# Patient Record
Sex: Female | Born: 1965
Health system: Southern US, Community
[De-identification: ages and names within clinical notes are randomized; demographics above are authoritative.]

## PROBLEM LIST (undated history)

## (undated) DIAGNOSIS — E119 Type 2 diabetes mellitus without complications: Secondary | ICD-10-CM

## (undated) DIAGNOSIS — J45909 Unspecified asthma, uncomplicated: Secondary | ICD-10-CM

## (undated) DIAGNOSIS — I10 Essential (primary) hypertension: Secondary | ICD-10-CM

## (undated) HISTORY — DX: Essential (primary) hypertension: I10

## (undated) HISTORY — DX: Unspecified asthma, uncomplicated: J45.909

## (undated) HISTORY — DX: Type 2 diabetes mellitus without complications: E11.9

---

## 2017-01-28 DIAGNOSIS — E7800 Pure hypercholesterolemia, unspecified: Secondary | ICD-10-CM | POA: Diagnosis not present

## 2017-01-28 DIAGNOSIS — I10 Essential (primary) hypertension: Secondary | ICD-10-CM | POA: Diagnosis not present

## 2017-01-28 DIAGNOSIS — E119 Type 2 diabetes mellitus without complications: Secondary | ICD-10-CM | POA: Diagnosis not present

## 2017-01-28 DIAGNOSIS — M722 Plantar fascial fibromatosis: Secondary | ICD-10-CM | POA: Diagnosis not present

## 2017-01-30 DIAGNOSIS — E7800 Pure hypercholesterolemia, unspecified: Secondary | ICD-10-CM | POA: Diagnosis not present

## 2017-01-30 DIAGNOSIS — I10 Essential (primary) hypertension: Secondary | ICD-10-CM | POA: Diagnosis not present

## 2017-01-30 DIAGNOSIS — E119 Type 2 diabetes mellitus without complications: Secondary | ICD-10-CM | POA: Diagnosis not present

## 2017-04-03 DIAGNOSIS — E7800 Pure hypercholesterolemia, unspecified: Secondary | ICD-10-CM | POA: Diagnosis not present

## 2017-08-11 ENCOUNTER — Other Ambulatory Visit (HOSPITAL_COMMUNITY)
Admission: RE | Admit: 2017-08-11 | Discharge: 2017-08-11 | Disposition: A | Discharge status: Home / Self Care | Payer: BLUE CROSS/BLUE SHIELD | Source: Ambulatory Visit | Attending: Obstetrics and Gynecology | Admitting: Obstetrics and Gynecology

## 2017-08-11 ENCOUNTER — Other Ambulatory Visit: Payer: Self-pay | Admitting: Obstetrics and Gynecology

## 2017-08-11 DIAGNOSIS — Z01419 Encounter for gynecological examination (general) (routine) without abnormal findings: Secondary | ICD-10-CM | POA: Diagnosis not present

## 2017-08-11 DIAGNOSIS — Z124 Encounter for screening for malignant neoplasm of cervix: Secondary | ICD-10-CM | POA: Insufficient documentation

## 2017-08-11 DIAGNOSIS — Z139 Encounter for screening, unspecified: Secondary | ICD-10-CM

## 2017-08-12 LAB — CYTOLOGY - PAP
Diagnosis: NEGATIVE
HPV: NOT DETECTED

## 2017-08-13 ENCOUNTER — Encounter: Payer: Self-pay | Admitting: Radiology

## 2017-08-13 ENCOUNTER — Ambulatory Visit
Admission: RE | Admit: 2017-08-13 | Discharge: 2017-08-13 | Disposition: A | Discharge status: Home / Self Care | Payer: BLUE CROSS/BLUE SHIELD | Source: Ambulatory Visit | Attending: Obstetrics and Gynecology | Admitting: Obstetrics and Gynecology

## 2017-08-13 DIAGNOSIS — Z139 Encounter for screening, unspecified: Secondary | ICD-10-CM

## 2017-08-13 DIAGNOSIS — Z1231 Encounter for screening mammogram for malignant neoplasm of breast: Secondary | ICD-10-CM | POA: Diagnosis not present

## 2017-10-28 DIAGNOSIS — E7800 Pure hypercholesterolemia, unspecified: Secondary | ICD-10-CM | POA: Diagnosis not present

## 2017-10-28 DIAGNOSIS — I10 Essential (primary) hypertension: Secondary | ICD-10-CM | POA: Diagnosis not present

## 2017-10-28 DIAGNOSIS — H00014 Hordeolum externum left upper eyelid: Secondary | ICD-10-CM | POA: Diagnosis not present

## 2017-10-28 DIAGNOSIS — E119 Type 2 diabetes mellitus without complications: Secondary | ICD-10-CM | POA: Diagnosis not present

## 2017-10-28 DIAGNOSIS — H02886 Meibomian gland dysfunction of left eye, unspecified eyelid: Secondary | ICD-10-CM | POA: Diagnosis not present

## 2017-10-28 DIAGNOSIS — H18611 Keratoconus, stable, right eye: Secondary | ICD-10-CM | POA: Diagnosis not present

## 2017-10-28 DIAGNOSIS — H02883 Meibomian gland dysfunction of right eye, unspecified eyelid: Secondary | ICD-10-CM | POA: Diagnosis not present

## 2017-11-18 DIAGNOSIS — H0014 Chalazion left upper eyelid: Secondary | ICD-10-CM | POA: Diagnosis not present

## 2017-11-18 DIAGNOSIS — H02886 Meibomian gland dysfunction of left eye, unspecified eyelid: Secondary | ICD-10-CM | POA: Diagnosis not present

## 2017-11-18 DIAGNOSIS — H02883 Meibomian gland dysfunction of right eye, unspecified eyelid: Secondary | ICD-10-CM | POA: Diagnosis not present

## 2017-11-27 DIAGNOSIS — H0014 Chalazion left upper eyelid: Secondary | ICD-10-CM | POA: Diagnosis not present

## 2018-01-27 DIAGNOSIS — H18611 Keratoconus, stable, right eye: Secondary | ICD-10-CM | POA: Diagnosis not present

## 2018-01-27 DIAGNOSIS — E119 Type 2 diabetes mellitus without complications: Secondary | ICD-10-CM | POA: Diagnosis not present

## 2018-01-27 DIAGNOSIS — H2513 Age-related nuclear cataract, bilateral: Secondary | ICD-10-CM | POA: Diagnosis not present

## 2018-05-04 DIAGNOSIS — E119 Type 2 diabetes mellitus without complications: Secondary | ICD-10-CM | POA: Diagnosis not present

## 2018-05-04 DIAGNOSIS — I10 Essential (primary) hypertension: Secondary | ICD-10-CM | POA: Diagnosis not present

## 2018-05-04 DIAGNOSIS — E7800 Pure hypercholesterolemia, unspecified: Secondary | ICD-10-CM | POA: Diagnosis not present

## 2018-07-14 ENCOUNTER — Other Ambulatory Visit: Payer: Self-pay | Admitting: Obstetrics and Gynecology

## 2018-07-14 DIAGNOSIS — Z1231 Encounter for screening mammogram for malignant neoplasm of breast: Secondary | ICD-10-CM

## 2018-08-21 ENCOUNTER — Ambulatory Visit
Admission: RE | Admit: 2018-08-21 | Discharge: 2018-08-21 | Disposition: A | Discharge status: Home / Self Care | Payer: BLUE CROSS/BLUE SHIELD | Source: Ambulatory Visit | Attending: Obstetrics and Gynecology | Admitting: Obstetrics and Gynecology

## 2018-08-21 DIAGNOSIS — Z1231 Encounter for screening mammogram for malignant neoplasm of breast: Secondary | ICD-10-CM

## 2018-09-28 DIAGNOSIS — Z01419 Encounter for gynecological examination (general) (routine) without abnormal findings: Secondary | ICD-10-CM | POA: Diagnosis not present

## 2018-11-16 DIAGNOSIS — J302 Other seasonal allergic rhinitis: Secondary | ICD-10-CM | POA: Diagnosis not present

## 2018-11-16 DIAGNOSIS — I10 Essential (primary) hypertension: Secondary | ICD-10-CM | POA: Diagnosis not present

## 2018-11-16 DIAGNOSIS — E1169 Type 2 diabetes mellitus with other specified complication: Secondary | ICD-10-CM | POA: Diagnosis not present

## 2018-11-16 DIAGNOSIS — E7800 Pure hypercholesterolemia, unspecified: Secondary | ICD-10-CM | POA: Diagnosis not present

## 2018-12-14 DIAGNOSIS — J0110 Acute frontal sinusitis, unspecified: Secondary | ICD-10-CM | POA: Diagnosis not present

## 2019-02-15 DIAGNOSIS — Z1211 Encounter for screening for malignant neoplasm of colon: Secondary | ICD-10-CM | POA: Diagnosis not present

## 2019-02-15 DIAGNOSIS — D123 Benign neoplasm of transverse colon: Secondary | ICD-10-CM | POA: Diagnosis not present

## 2019-02-15 DIAGNOSIS — K5730 Diverticulosis of large intestine without perforation or abscess without bleeding: Secondary | ICD-10-CM | POA: Diagnosis not present

## 2019-03-22 DIAGNOSIS — H5213 Myopia, bilateral: Secondary | ICD-10-CM | POA: Diagnosis not present

## 2019-03-22 DIAGNOSIS — E119 Type 2 diabetes mellitus without complications: Secondary | ICD-10-CM | POA: Diagnosis not present

## 2019-03-22 DIAGNOSIS — H2513 Age-related nuclear cataract, bilateral: Secondary | ICD-10-CM | POA: Diagnosis not present

## 2019-03-22 DIAGNOSIS — H18611 Keratoconus, stable, right eye: Secondary | ICD-10-CM | POA: Diagnosis not present

## 2019-08-24 ENCOUNTER — Other Ambulatory Visit: Payer: Self-pay | Admitting: Obstetrics and Gynecology

## 2019-08-24 DIAGNOSIS — Z1231 Encounter for screening mammogram for malignant neoplasm of breast: Secondary | ICD-10-CM

## 2019-10-04 ENCOUNTER — Ambulatory Visit
Admission: RE | Admit: 2019-10-04 | Discharge: 2019-10-04 | Disposition: A | Discharge status: Home / Self Care | Payer: 59 | Source: Ambulatory Visit | Attending: Obstetrics and Gynecology | Admitting: Obstetrics and Gynecology

## 2019-10-04 ENCOUNTER — Other Ambulatory Visit: Payer: Self-pay

## 2019-10-04 DIAGNOSIS — Z1231 Encounter for screening mammogram for malignant neoplasm of breast: Secondary | ICD-10-CM

## 2020-08-30 ENCOUNTER — Other Ambulatory Visit: Payer: Self-pay | Admitting: Obstetrics and Gynecology

## 2020-08-30 DIAGNOSIS — Z1231 Encounter for screening mammogram for malignant neoplasm of breast: Secondary | ICD-10-CM

## 2020-10-17 ENCOUNTER — Ambulatory Visit: Payer: 59

## 2020-10-30 ENCOUNTER — Inpatient Hospital Stay: Admission: RE | Admit: 2020-10-30 | Payer: 59 | Source: Ambulatory Visit

## 2020-11-13 LAB — HM PAP SMEAR: HM Pap smear: NORMAL

## 2020-12-19 ENCOUNTER — Other Ambulatory Visit: Payer: Self-pay

## 2020-12-19 ENCOUNTER — Ambulatory Visit
Admission: RE | Admit: 2020-12-19 | Discharge: 2020-12-19 | Disposition: A | Discharge status: Home / Self Care | Payer: 59 | Source: Ambulatory Visit | Attending: Obstetrics and Gynecology | Admitting: Obstetrics and Gynecology

## 2020-12-19 DIAGNOSIS — Z1231 Encounter for screening mammogram for malignant neoplasm of breast: Secondary | ICD-10-CM

## 2020-12-22 ENCOUNTER — Other Ambulatory Visit: Payer: Self-pay | Admitting: Obstetrics and Gynecology

## 2020-12-22 DIAGNOSIS — R928 Other abnormal and inconclusive findings on diagnostic imaging of breast: Secondary | ICD-10-CM

## 2020-12-27 HISTORY — PX: BREAST BIOPSY: SHX20

## 2021-01-03 ENCOUNTER — Ambulatory Visit
Admission: RE | Admit: 2021-01-03 | Discharge: 2021-01-03 | Disposition: A | Discharge status: Home / Self Care | Payer: 59 | Source: Ambulatory Visit | Attending: Obstetrics and Gynecology | Admitting: Obstetrics and Gynecology

## 2021-01-03 ENCOUNTER — Other Ambulatory Visit: Payer: Self-pay | Admitting: Obstetrics and Gynecology

## 2021-01-03 ENCOUNTER — Other Ambulatory Visit: Payer: Self-pay

## 2021-01-03 DIAGNOSIS — R928 Other abnormal and inconclusive findings on diagnostic imaging of breast: Secondary | ICD-10-CM

## 2021-01-03 DIAGNOSIS — R921 Mammographic calcification found on diagnostic imaging of breast: Secondary | ICD-10-CM

## 2021-01-10 ENCOUNTER — Ambulatory Visit
Admission: RE | Admit: 2021-01-10 | Discharge: 2021-01-10 | Disposition: A | Discharge status: Home / Self Care | Payer: 59 | Source: Ambulatory Visit | Attending: Obstetrics and Gynecology | Admitting: Obstetrics and Gynecology

## 2021-01-10 ENCOUNTER — Other Ambulatory Visit: Payer: Self-pay

## 2021-01-10 ENCOUNTER — Other Ambulatory Visit: Payer: Self-pay | Admitting: Obstetrics and Gynecology

## 2021-01-10 DIAGNOSIS — R921 Mammographic calcification found on diagnostic imaging of breast: Secondary | ICD-10-CM

## 2021-07-30 ENCOUNTER — Other Ambulatory Visit (HOSPITAL_COMMUNITY): Payer: Self-pay

## 2021-11-30 ENCOUNTER — Other Ambulatory Visit: Payer: Self-pay

## 2022-01-21 DIAGNOSIS — Z1231 Encounter for screening mammogram for malignant neoplasm of breast: Secondary | ICD-10-CM

## 2022-06-26 ENCOUNTER — Ambulatory Visit (INDEPENDENT_AMBULATORY_CARE_PROVIDER_SITE_OTHER): Payer: BC Managed Care – PPO | Admitting: Family Medicine

## 2022-06-26 VITALS — BP 147/84 | HR 73 | Temp 98.1°F | Resp 16 | Ht 65.0 in | Wt 241.0 lb

## 2022-06-26 DIAGNOSIS — Z7689 Persons encountering health services in other specified circumstances: Secondary | ICD-10-CM

## 2022-06-26 DIAGNOSIS — E119 Type 2 diabetes mellitus without complications: Secondary | ICD-10-CM | POA: Diagnosis not present

## 2022-06-26 DIAGNOSIS — Z13228 Encounter for screening for other metabolic disorders: Secondary | ICD-10-CM | POA: Insufficient documentation

## 2022-06-26 DIAGNOSIS — I10 Essential (primary) hypertension: Secondary | ICD-10-CM | POA: Diagnosis not present

## 2022-06-26 DIAGNOSIS — E7800 Pure hypercholesterolemia, unspecified: Secondary | ICD-10-CM

## 2022-06-26 DIAGNOSIS — Z794 Long term (current) use of insulin: Secondary | ICD-10-CM

## 2022-06-26 DIAGNOSIS — E1159 Type 2 diabetes mellitus with other circulatory complications: Secondary | ICD-10-CM | POA: Insufficient documentation

## 2022-06-26 DIAGNOSIS — E785 Hyperlipidemia, unspecified: Secondary | ICD-10-CM | POA: Insufficient documentation

## 2022-06-26 LAB — POCT GLYCOSYLATED HEMOGLOBIN (HGB A1C): Hemoglobin A1C: 7.5 % — AB (ref 4.0–5.6)

## 2022-06-26 MED ORDER — LISINOPRIL-HYDROCHLOROTHIAZIDE 10-12.5 MG PO TABS: 1.0000 | ORAL_TABLET | Freq: Every day | ORAL | 1 refills | Status: DC

## 2022-06-26 MED ORDER — METFORMIN HCL 850 MG PO TABS: 850.0000 mg | ORAL_TABLET | Freq: Two times a day (BID) | ORAL | 1 refills | Status: DC

## 2022-06-26 MED ORDER — SIMVASTATIN 40 MG PO TABS: ORAL_TABLET | ORAL | 1 refills | Status: DC

## 2022-06-26 NOTE — Progress Notes (Unsigned)
Patient is here to established care with provider. ~health hx address ~care gaps address  

## 2022-06-27 ENCOUNTER — Encounter: Payer: Self-pay | Admitting: Family Medicine

## 2022-06-27 NOTE — Progress Notes (Signed)
New Patient Office Visit  Subjective    Patient ID: Kara Gray, female    DOB: 1966/02/06  Age: 56 y.o. MRN: [id number]  CC:  Chief Complaint  Patient presents with   Establish Care    HPI Kara Gray presents to establish care and for review of chronic med issues. Patient denies acute complaints or concerns.    Outpatient Encounter Medications as of 06/26/2022  Medication Sig   ACCU-CHEK GUIDE test strip CHECK GLUCOSE IN VITRO ONCE A DAY   Accu-Chek Softclix Lancets lancets SMARTSIG:Topical   albuterol (VENTOLIN HFA) 108 (90 Base) MCG/ACT inhaler    metFORMIN (GLUCOPHAGE) 850 MG tablet Take 1 tablet (850 mg total) by mouth 2 (two) times daily with a meal.   [DISCONTINUED] lisinopril-hydrochlorothiazide (ZESTORETIC) 10-12.5 MG tablet    [DISCONTINUED] Metformin HCl 500 MG/5ML SOLN    [DISCONTINUED] simvastatin (ZOCOR) 40 MG tablet SMARTSIG:1 Tablet(s) By Mouth Every Evening   lisinopril-hydrochlorothiazide (ZESTORETIC) 10-12.5 MG tablet Take 1 tablet by mouth daily.   simvastatin (ZOCOR) 40 MG tablet SMARTSIG:1 Tablet(s) By Mouth Every Evening   No facility-administered encounter medications on file as of 06/26/2022.    No past medical history on file.  No past surgical history on file.  Family History  Problem Relation Age of Onset   Breast cancer Paternal Aunt        unsure of age    Social History   Socioeconomic History   Marital status: Divorced    Spouse name: Not on file   Number of children: Not on file   Years of education: Not on file   Highest education level: Not on file  Occupational History   Not on file  Tobacco Use   Smoking status: Not on file   Smokeless tobacco: Not on file  Substance and Sexual Activity   Alcohol use: Not on file   Drug use: Not on file   Sexual activity: Not on file  Other Topics Concern   Not on file  Social History Narrative   Not on file   Social Determinants of Health   Financial Resource Strain: Not  on file  Food Insecurity: Not on file  Transportation Needs: Not on file  Physical Activity: Not on file  Stress: Not on file  Social Connections: Not on file  Intimate Partner Violence: Not on file    Review of Systems  All other systems reviewed and are negative.       Objective    BP (!) 147/84   Pulse 73   Temp 98.1 F (36.7 C) (Oral)   Resp 16   Ht 5\' 5"  (1.651 m)   Wt 241 lb (109.3 kg)   SpO2 95%   BMI 40.10 kg/m   Physical Exam Vitals and nursing note reviewed.  Constitutional:      General: She is not in acute distress. Cardiovascular:     Rate and Rhythm: Normal rate and regular rhythm.  Pulmonary:     Effort: Pulmonary effort is normal.     Breath sounds: Normal breath sounds.  Abdominal:     Palpations: Abdomen is soft.     Tenderness: There is no abdominal tenderness.  Musculoskeletal:     Right lower leg: No edema.     Left lower leg: No edema.  Neurological:     General: No focal deficit present.     Mental Status: She is alert and oriented to person, place, and time.  Psychiatric:  Mood and Affect: Mood normal.        Behavior: Behavior normal.         Assessment & Plan:   1. Type 2 diabetes mellitus without complication, with long-term current use of insulin (HCC) A1c is elevated above goal. Will increase metformin from 500 to 850 mg bid and monitor - POCT glycosylated hemoglobin (Hb A1C)  2. Primary hypertension Slightly elevated reading. Patient defers change in management at this time. Meds refilled  3. Hypercholesterolemia Continue present management. Meds refilled.   4. Encounter to establish care     Return in about 3 months (around 09/26/2022) for follow up.   Tommie Raymond, MD

## 2022-07-01 DIAGNOSIS — H01119 Allergic dermatitis of unspecified eye, unspecified eyelid: Secondary | ICD-10-CM | POA: Diagnosis not present

## 2022-07-09 ENCOUNTER — Encounter: Payer: Self-pay | Admitting: Family Medicine

## 2022-07-16 ENCOUNTER — Other Ambulatory Visit: Payer: Self-pay | Admitting: *Deleted

## 2022-07-16 MED ORDER — LISINOPRIL-HYDROCHLOROTHIAZIDE 10-12.5 MG PO TABS: 1.0000 | ORAL_TABLET | Freq: Every day | ORAL | 1 refills | Status: DC

## 2022-07-18 ENCOUNTER — Encounter: Payer: Self-pay | Admitting: Family Medicine

## 2022-08-06 DIAGNOSIS — Z79899 Other long term (current) drug therapy: Secondary | ICD-10-CM | POA: Diagnosis not present

## 2022-08-06 DIAGNOSIS — E7800 Pure hypercholesterolemia, unspecified: Secondary | ICD-10-CM | POA: Diagnosis not present

## 2022-08-06 DIAGNOSIS — E113292 Type 2 diabetes mellitus with mild nonproliferative diabetic retinopathy without macular edema, left eye: Secondary | ICD-10-CM | POA: Diagnosis not present

## 2022-08-06 DIAGNOSIS — Z6839 Body mass index (BMI) 39.0-39.9, adult: Secondary | ICD-10-CM | POA: Diagnosis not present

## 2022-09-26 ENCOUNTER — Ambulatory Visit: Payer: BC Managed Care – PPO | Admitting: Family Medicine

## 2022-10-07 LAB — HM DIABETES EYE EXAM

## 2022-10-31 IMAGING — MG MM BREAST LOCALIZATION CLIP
5 series · 6 of 13 positions shown · non-contrast
Comparison: Previous exam(s).

CLINICAL DATA: Post biopsy mammogram of the right breast for clip
placement.

EXAM:
DIAGNOSTIC RIGHT MAMMOGRAM POST STEREOTACTIC BIOPSY

[R CC]
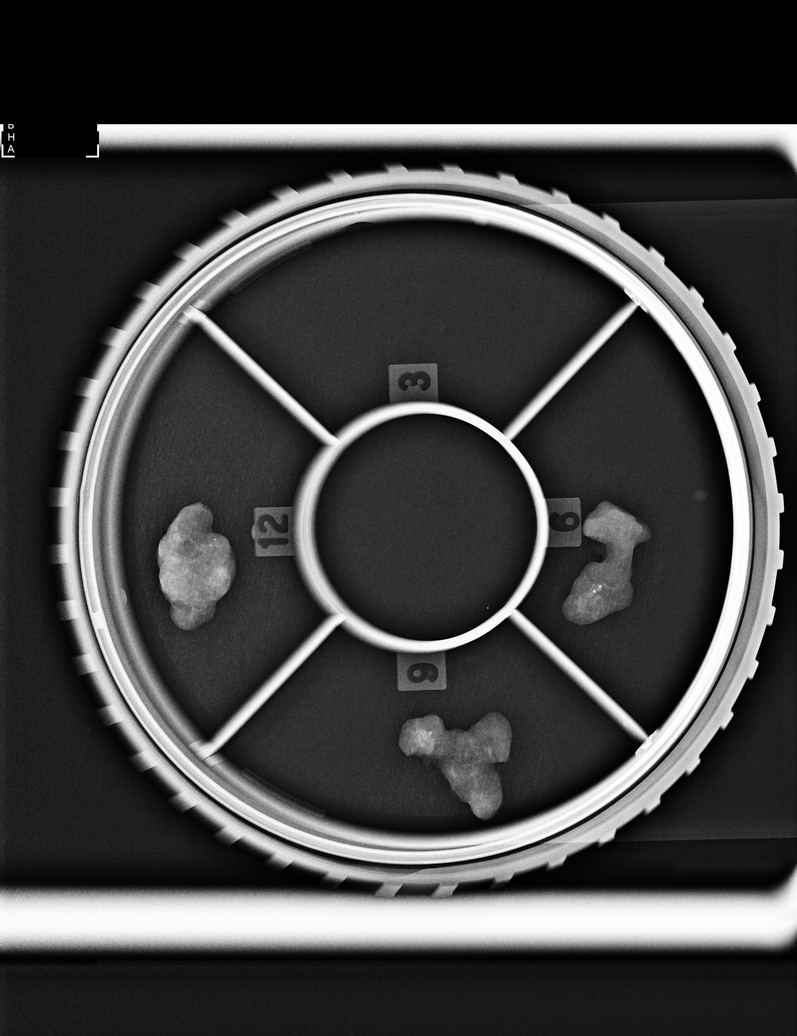

[R ML synth-2D]
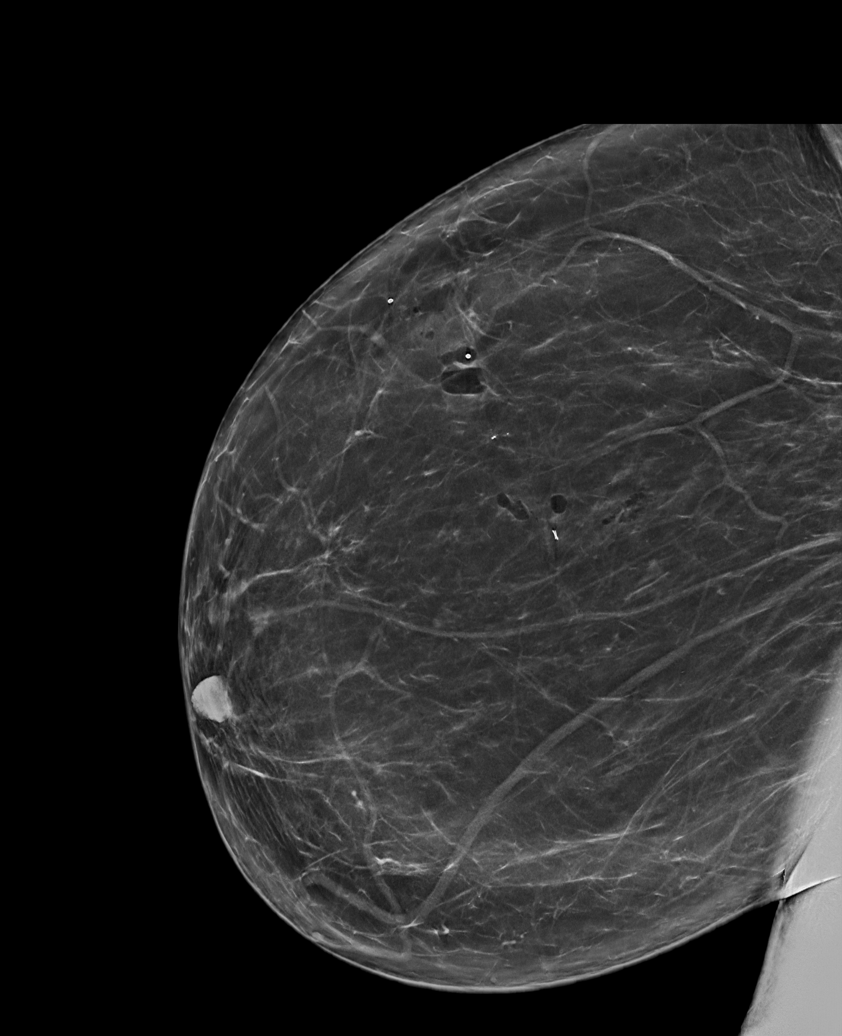

[R CC synth-2D]
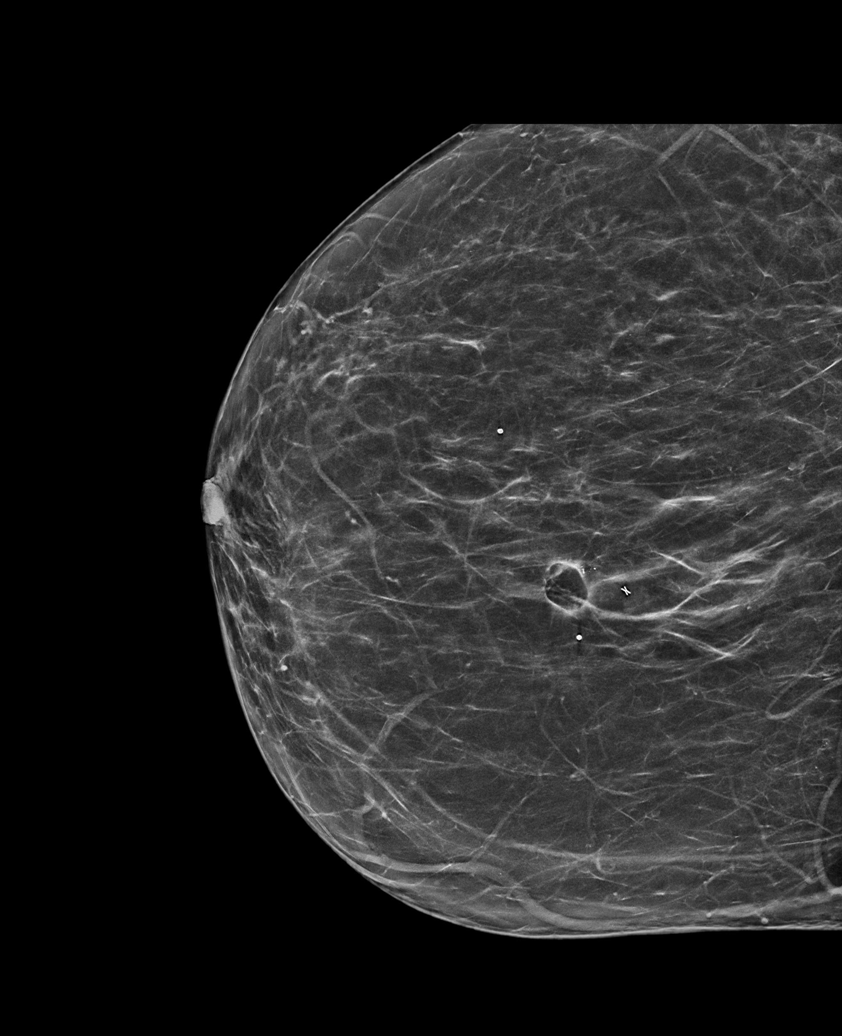

[R CC tomo · 2 of 75 frames shown]
[frame 25/75]
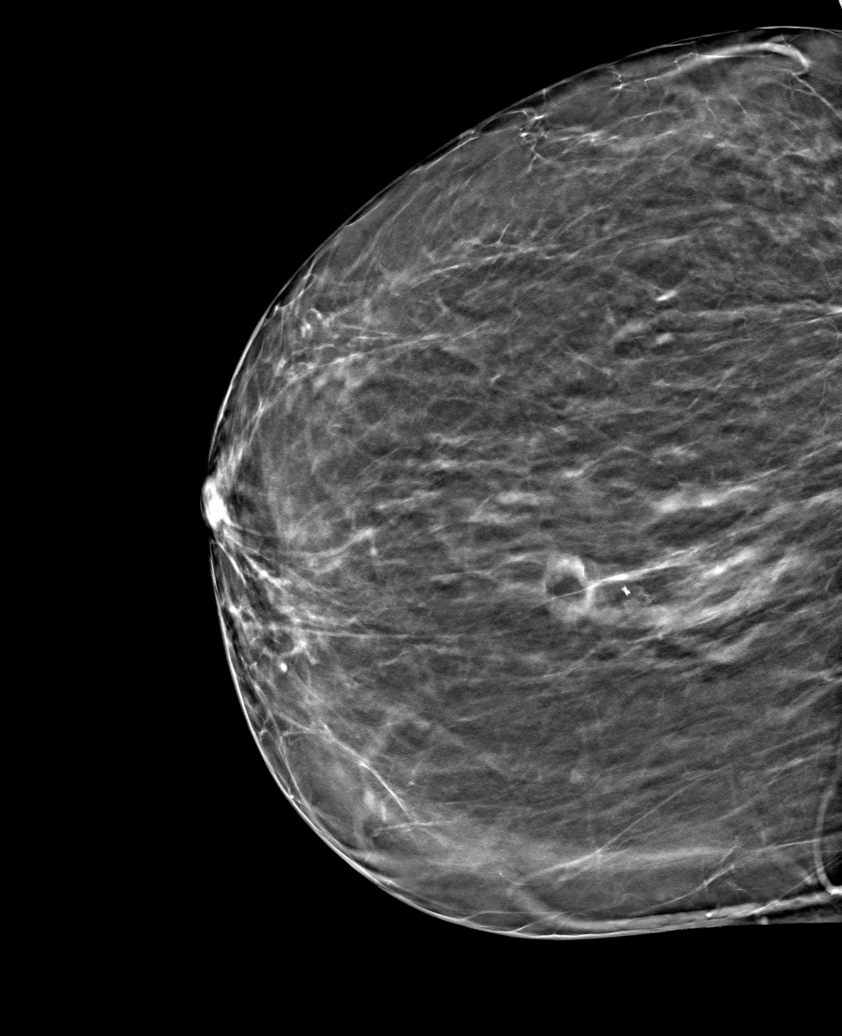
[frame 38/75]
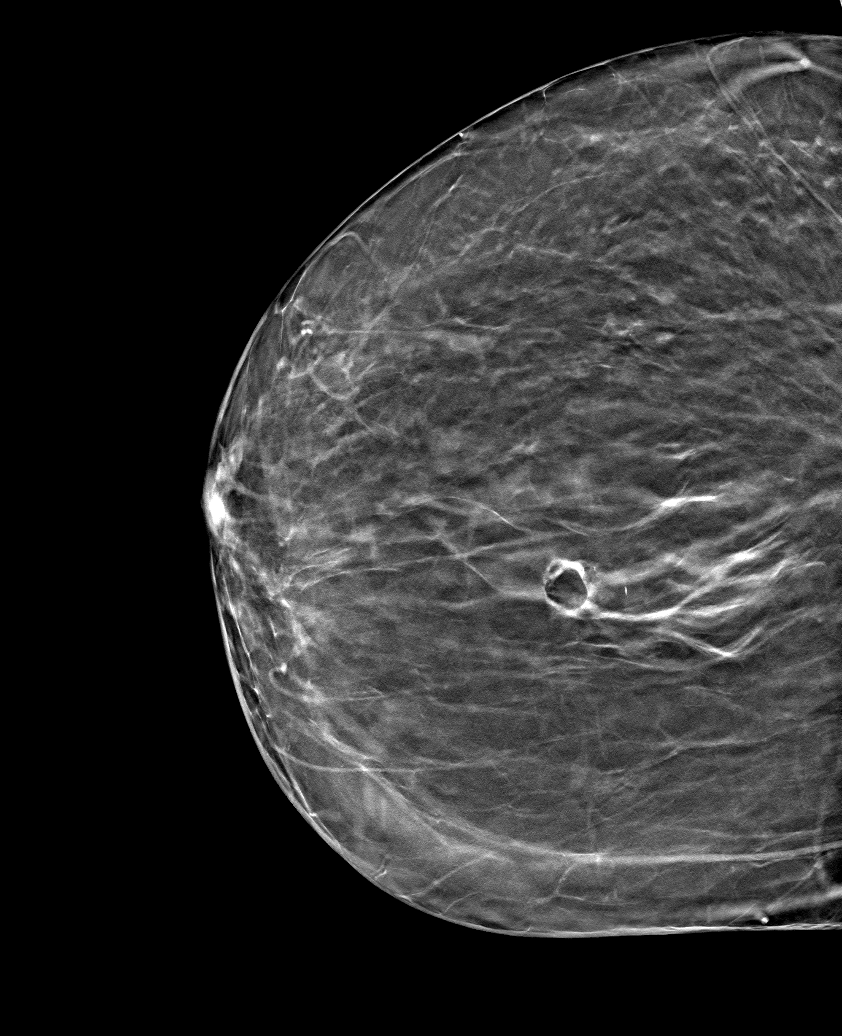

[R ML tomo · tomo slice 41/82.0]
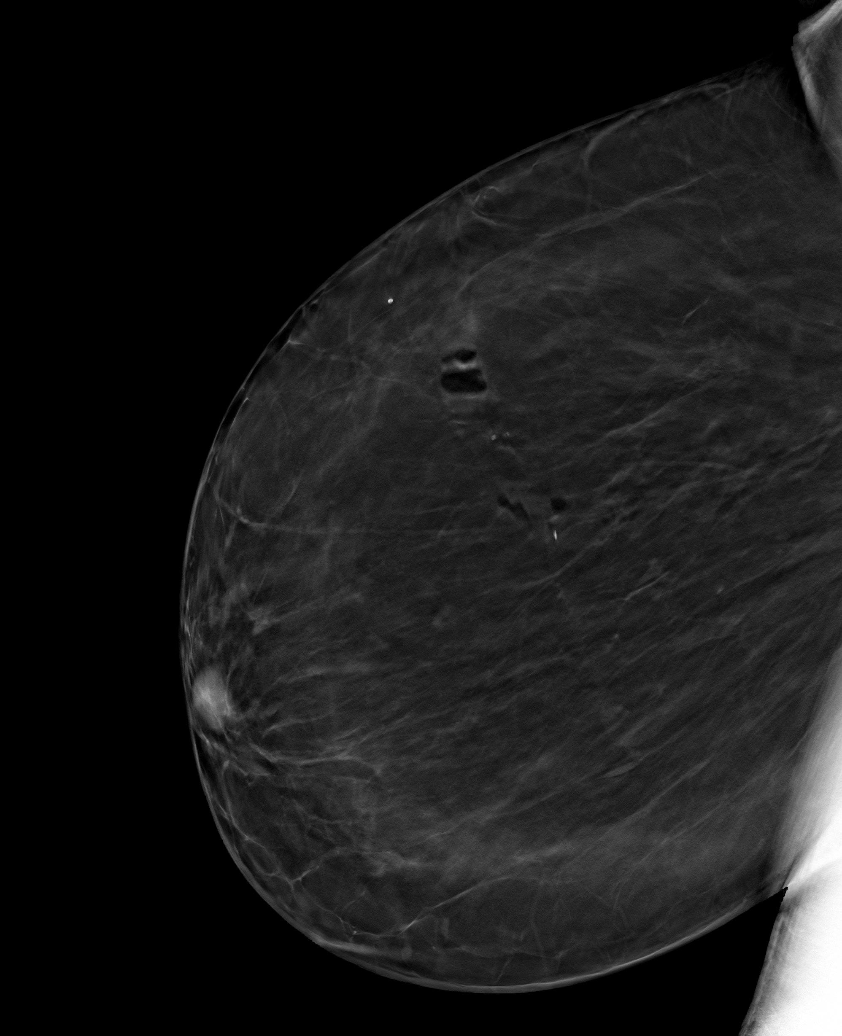

[6 of 13 positions shown; findings below may reference images not displayed]

FINDINGS: Mammographic images were obtained following stereotactic guided
biopsy of calcifications in the upper inner right breast. The biopsy
marking clip is is approximately 2.8 cm inferiorly displaced from
the site of biopsy. There are residual calcifications for
localization if necessary.
IMPRESSION: The X shaped biopsy marking clip is approximately 2.8 cm inferiorly
displaced from the site of biopsy in the upper inner right breast.
If localization for excision is necessary, there are residual
calcifications for targeting.

Final Assessment: Post Procedure Mammograms for Marker Placement

## 2022-11-08 DIAGNOSIS — Z79899 Other long term (current) drug therapy: Secondary | ICD-10-CM | POA: Diagnosis not present

## 2022-11-08 DIAGNOSIS — I10 Essential (primary) hypertension: Secondary | ICD-10-CM | POA: Diagnosis not present

## 2022-11-08 DIAGNOSIS — E7800 Pure hypercholesterolemia, unspecified: Secondary | ICD-10-CM | POA: Diagnosis not present

## 2022-11-08 DIAGNOSIS — E113292 Type 2 diabetes mellitus with mild nonproliferative diabetic retinopathy without macular edema, left eye: Secondary | ICD-10-CM | POA: Diagnosis not present

## 2022-11-11 DIAGNOSIS — J309 Allergic rhinitis, unspecified: Secondary | ICD-10-CM | POA: Diagnosis not present

## 2022-11-11 DIAGNOSIS — E113292 Type 2 diabetes mellitus with mild nonproliferative diabetic retinopathy without macular edema, left eye: Secondary | ICD-10-CM | POA: Diagnosis not present

## 2022-11-11 DIAGNOSIS — I10 Essential (primary) hypertension: Secondary | ICD-10-CM | POA: Diagnosis not present

## 2022-11-11 DIAGNOSIS — Z79899 Other long term (current) drug therapy: Secondary | ICD-10-CM | POA: Diagnosis not present

## 2022-11-11 DIAGNOSIS — E7800 Pure hypercholesterolemia, unspecified: Secondary | ICD-10-CM | POA: Diagnosis not present

## 2022-11-29 ENCOUNTER — Other Ambulatory Visit: Payer: Self-pay | Admitting: Family Medicine

## 2022-12-01 ENCOUNTER — Other Ambulatory Visit: Payer: Self-pay | Admitting: Family Medicine

## 2022-12-22 ENCOUNTER — Other Ambulatory Visit: Payer: Self-pay | Admitting: Family Medicine

## 2022-12-24 ENCOUNTER — Other Ambulatory Visit: Payer: Self-pay | Admitting: Obstetrics and Gynecology

## 2022-12-24 DIAGNOSIS — Z0000 Encounter for general adult medical examination without abnormal findings: Secondary | ICD-10-CM

## 2022-12-24 DIAGNOSIS — Z01419 Encounter for gynecological examination (general) (routine) without abnormal findings: Secondary | ICD-10-CM | POA: Diagnosis not present

## 2022-12-24 NOTE — Telephone Encounter (Signed)
Requested medications are due for refill today.  yes  Requested medications are on the active medications list.  yes  Last refill. 11/29/2022 #60 0 rf  Future visit scheduled.   no  Notes to clinic.  Missing labs - pt needs an appt.    Requested Prescriptions  Pending Prescriptions Disp Refills   metFORMIN (GLUCOPHAGE) 850 MG tablet [Pharmacy Med Name: METFORMIN HCL 850 MG TABLET] 180 tablet 1    Sig: TAKE 1 TABLET (850 MG TOTAL) BY MOUTH 2 (TWO) TIMES DAILY WITH A MEAL.     Endocrinology:  Diabetes - Biguanides Failed - 12/22/2022  1:31 PM      Failed - Cr in normal range and within 360 days    No results found for: "CREATININE", "LABCREAU", "LABCREA", "POCCRE"       Failed - eGFR in normal range and within 360 days    No results found for: "GFRAA", "GFRNONAA", "GFR", "EGFR"       Failed - B12 Level in normal range and within 720 days    No results found for: "VITAMINB12"       Failed - CBC within normal limits and completed in the last 12 months    No results found for: "WBC", "WBCKUC" No results found for: "RBC", "RBCKUC" No results found for: "HGB", "HGBKUC", "HGBPOCKUC", "HGBOTHER", "TOTHGB", "HGBPLASMA", "LABHEMOF" No results found for: "HCT", "HCTKUC", "SRHCT" No results found for: "MCHC", "MCHCKUC" No results found for: "MCH", "MCHKUC" No results found for: "MCVKUC", "MCV" No results found for: "PLTCOUNTKUC", "LABPLAT", "POCPLA" No results found for: "RDW", "RDWKUC", "POCRDW"       Passed - HBA1C is between 0 and 7.9 and within 180 days    Hemoglobin A1C  Date Value Ref Range Status  06/26/2022 7.5 (A) 4.0 - 5.6 % Final         Passed - Valid encounter within last 6 months    Recent Outpatient Visits           6 months ago Type 2 diabetes mellitus without complication, with long-term current use of insulin (HCC)   [facility], MD

## 2023-01-02 ENCOUNTER — Other Ambulatory Visit: Payer: Self-pay | Admitting: Family Medicine

## 2023-01-10 ENCOUNTER — Ambulatory Visit
Admission: RE | Admit: 2023-01-10 | Discharge: 2023-01-10 | Disposition: A | Discharge status: Home / Self Care | Payer: BC Managed Care – PPO | Source: Ambulatory Visit | Attending: Obstetrics and Gynecology | Admitting: Obstetrics and Gynecology

## 2023-01-10 DIAGNOSIS — Z1231 Encounter for screening mammogram for malignant neoplasm of breast: Secondary | ICD-10-CM | POA: Diagnosis not present

## 2023-01-10 DIAGNOSIS — Z0000 Encounter for general adult medical examination without abnormal findings: Secondary | ICD-10-CM

## 2023-02-01 ENCOUNTER — Other Ambulatory Visit: Payer: Self-pay | Admitting: Family Medicine

## 2023-04-11 ENCOUNTER — Other Ambulatory Visit: Payer: Self-pay | Admitting: Family Medicine

## 2023-05-05 ENCOUNTER — Encounter: Payer: Self-pay | Admitting: Internal Medicine

## 2023-05-05 ENCOUNTER — Ambulatory Visit: Payer: BC Managed Care – PPO | Attending: Internal Medicine | Admitting: Internal Medicine

## 2023-05-05 VITALS — BP 137/80 | HR 78 | Temp 98.3°F | Ht 65.0 in | Wt 231.0 lb

## 2023-05-05 DIAGNOSIS — I152 Hypertension secondary to endocrine disorders: Secondary | ICD-10-CM

## 2023-05-05 DIAGNOSIS — E785 Hyperlipidemia, unspecified: Secondary | ICD-10-CM

## 2023-05-05 DIAGNOSIS — Z7984 Long term (current) use of oral hypoglycemic drugs: Secondary | ICD-10-CM | POA: Diagnosis not present

## 2023-05-05 DIAGNOSIS — E1169 Type 2 diabetes mellitus with other specified complication: Secondary | ICD-10-CM

## 2023-05-05 DIAGNOSIS — E1159 Type 2 diabetes mellitus with other circulatory complications: Secondary | ICD-10-CM

## 2023-05-05 DIAGNOSIS — E119 Type 2 diabetes mellitus without complications: Secondary | ICD-10-CM

## 2023-05-05 DIAGNOSIS — E669 Obesity, unspecified: Secondary | ICD-10-CM

## 2023-05-05 DIAGNOSIS — Z7689 Persons encountering health services in other specified circumstances: Secondary | ICD-10-CM

## 2023-05-05 DIAGNOSIS — Z6838 Body mass index (BMI) 38.0-38.9, adult: Secondary | ICD-10-CM

## 2023-05-05 LAB — POCT GLYCOSYLATED HEMOGLOBIN (HGB A1C): HbA1c, POC (controlled diabetic range): 5.6 % (ref 0.0–7.0)

## 2023-05-05 LAB — GLUCOSE, POCT (MANUAL RESULT ENTRY): POC Glucose: 69 mg/dL — AB (ref 70–99)

## 2023-05-05 MED ORDER — FREESTYLE LIBRE 3 SENSOR MISC: 6 refills | Status: DC

## 2023-05-05 MED ORDER — LISINOPRIL-HYDROCHLOROTHIAZIDE 10-12.5 MG PO TABS: 1.0000 | ORAL_TABLET | Freq: Every day | ORAL | 3 refills | Status: DC

## 2023-05-05 MED ORDER — SIMVASTATIN 40 MG PO TABS: ORAL_TABLET | ORAL | 1 refills | Status: DC

## 2023-05-05 MED ORDER — METFORMIN HCL 850 MG PO TABS: 850.0000 mg | ORAL_TABLET | Freq: Two times a day (BID) | ORAL | 1 refills | Status: DC

## 2023-05-05 MED ORDER — FREESTYLE LIBRE 3 READER DEVI: 1.0000 | Freq: Every day | 0 refills | Status: DC

## 2023-05-05 MED ORDER — EMPAGLIFLOZIN 10 MG PO TABS: 10.0000 mg | ORAL_TABLET | Freq: Every day | ORAL | 1 refills | Status: DC

## 2023-05-05 NOTE — Patient Instructions (Addendum)
Stop glipizide. Start Jardiance 10 mg daily instead. Continue to check your blood pressure at least once a week and record the readings.  Bring readings with you when you come back in 3 weeks to see our clinical pharmacist.  Goal for your blood pressure is 130/80 or lower.  Healthy Eating, Adult Healthy eating may help you get and keep a healthy body weight, reduce the risk of chronic disease, and live a long and productive life. It is important to follow a healthy eating pattern. Your nutritional and calorie needs should be met mainly by different nutrient-rich foods. What are tips for following this plan? Reading food labels Read labels and choose the following: Reduced or low sodium products. Juices with 100% fruit juice. Foods with low saturated fats (<3 g per serving) and high polyunsaturated and monounsaturated fats. Foods with whole grains, such as whole wheat, cracked wheat, brown rice, and wild rice. Whole grains that are fortified with folic acid. This is recommended for females who are pregnant or who want to become pregnant. Read labels and do not eat or drink the following: Foods or drinks with added sugars. These include foods that contain brown sugar, corn sweetener, corn syrup, dextrose, fructose, glucose, high-fructose corn syrup, honey, invert sugar, lactose, malt syrup, maltose, molasses, raw sugar, sucrose, trehalose, or turbinado sugar. Limit your intake of added sugars to less than 10% of your total daily calories. Do not eat more than the following amounts of added sugar per day: 6 teaspoons (25 g) for females. 9 teaspoons (38 g) for males. Foods that contain processed or refined starches and grains. Refined grain products, such as white flour, degermed cornmeal, white bread, and white rice. Shopping Choose nutrient-rich snacks, such as vegetables, whole fruits, and nuts. Avoid high-calorie and high-sugar snacks, such as potato chips, fruit snacks, and candy. Use  oil-based dressings and spreads on foods instead of solid fats such as butter, margarine, sour cream, or cream cheese. Limit pre-made sauces, mixes, and "instant" products such as flavored rice, instant noodles, and ready-made pasta. Try more plant-protein sources, such as tofu, tempeh, black beans, edamame, lentils, nuts, and seeds. Explore eating plans such as the Mediterranean diet or vegetarian diet. Try heart-healthy dips made with beans and healthy fats like hummus and guacamole. Vegetables go great with these. Cooking Use oil to saut or stir-fry foods instead of solid fats such as butter, margarine, or lard. Try baking, boiling, grilling, or broiling instead of frying. Remove the fatty part of meats before cooking. Steam vegetables in water or broth. Meal planning  At meals, imagine dividing your plate into fourths: One-half of your plate is fruits and vegetables. One-fourth of your plate is whole grains. One-fourth of your plate is protein, especially lean meats, poultry, eggs, tofu, beans, or nuts. Include low-fat dairy as part of your daily diet. Lifestyle Choose healthy options in all settings, including home, work, school, restaurants, or stores. Prepare your food safely: Wash your hands after handling raw meats. Where you prepare food, keep surfaces clean by regularly washing with hot, soapy water. Keep raw meats separate from ready-to-eat foods, such as fruits and vegetables. Cook seafood, meat, poultry, and eggs to the recommended temperature. Get a food thermometer. Store foods at safe temperatures. In general: Keep cold foods at 2F (4.4C) or below. Keep hot foods at 12F (60C) or above. Keep your freezer at [facility] (-17.8C) or below. Foods are not safe to eat if they have been between the temperatures of 40-12F (4.4-60C) for more  than 2 hours. What foods should I eat? Fruits Aim to eat 1-2 cups of fresh, canned (in natural juice), or frozen fruits each day. One  cup of fruit equals 1 small apple, 1 large banana, 8 large strawberries, 1 cup (237 g) canned fruit,  cup (82 g) dried fruit, or 1 cup (240 mL) 100% juice. Vegetables Aim to eat 2-4 cups of fresh and frozen vegetables each day, including different varieties and colors. One cup of vegetables equals 1 cup (91 g) broccoli or cauliflower florets, 2 medium carrots, 2 cups (150 g) raw, leafy greens, 1 large tomato, 1 large bell pepper, 1 large sweet potato, or 1 medium white potato. Grains Aim to eat 5-10 ounce-equivalents of whole grains each day. Examples of 1 ounce-equivalent of grains include 1 slice of bread, 1 cup (40 g) ready-to-eat cereal, 3 cups (24 g) popcorn, or  cup (93 g) cooked rice. Meats and other proteins Try to eat 5-7 ounce-equivalents of protein each day. Examples of 1 ounce-equivalent of protein include 1 egg,  oz nuts (12 almonds, 24 pistachios, or 7 walnut halves), 1/4 cup (90 g) cooked beans, 6 tablespoons (90 g) hummus or 1 tablespoon (16 g) peanut butter. A cut of meat or fish that is the size of a deck of cards is about 3-4 ounce-equivalents (85 g). Of the protein you eat each week, try to have at least 8 sounce (227 g) of seafood. This is about 2 servings per week. This includes salmon, trout, herring, sardines, and anchovies. Dairy Aim to eat 3 cup-equivalents of fat-free or low-fat dairy each day. Examples of 1 cup-equivalent of dairy include 1 cup (240 mL) milk, 8 ounces (250 g) yogurt, 1 ounces (44 g) natural cheese, or 1 cup (240 mL) fortified soy milk. Fats and oils Aim for about 5 teaspoons (21 g) of fats and oils per day. Choose monounsaturated fats, such as canola and olive oils, mayonnaise made with olive oil or avocado oil, avocados, peanut butter, and most nuts, or polyunsaturated fats, such as sunflower, corn, and soybean oils, walnuts, pine nuts, sesame seeds, sunflower seeds, and flaxseed. Beverages Aim for 6 eight-ounce glasses of water per day. Limit coffee to  3-5 eight-ounce cups per day. Limit caffeinated beverages that have added calories, such as soda and energy drinks. If you drink alcohol: Limit how much you have to: 0-1 drink a day if you are female. 0-2 drinks a day if you are female. Know how much alcohol is in your drink. In the [place] one drink is one 12 oz bottle of beer (355 mL), one 5 oz glass of wine (148 mL), or one 1 oz glass of hard liquor (44 mL). Seasoning and other foods Try not to add too much salt to your food. Try using herbs and spices instead of salt. Try not to add sugar to food. This information is based on [place] nutrition guidelines. To learn more, visit DisposableNylon.be. Exact amounts may vary. You may need different amounts. This information is not intended to replace advice given to you by your health care provider. Make sure you discuss any questions you have with your health care provider. Document Revised: 04/15/2022 Document Reviewed: 04/15/2022 Elsevier Patient Education  2024 [employer].

## 2023-05-05 NOTE — Progress Notes (Signed)
Patient ID: Kara Gray, female    DOB: [birth year]  MRN: [id number]  CC: Establish Care (Est care / new pt. Med refill /No questions/ concerns /Discuss shingles / Tdap vax./Instructed to sign ROI for colonoscopy)   Subjective: Kara Gray is a 57 y.o. female who presents for new pt visit. Her concerns today include:  Patient with history of DM type II, HTN, HL  Patient here to establish care.  She had seen Dr. Georganna Skeans November of last year at [facility].  Pt decided to change because she was charge $300 for routine check up. Previous was with Dr. Beverley Fiedler at [place]. Pt went back to [place] earlier this yr but Dr. Barbaraann Barthel had left, saw a NP  DM: Results for orders placed or performed in visit on 05/05/23  POCT glycosylated hemoglobin (Hb A1C)  Result Value Ref Range   Hemoglobin A1C     HbA1c POC (<> result, manual entry)     HbA1c, POC (prediabetic range)     HbA1c, POC (controlled diabetic range) 5.6 0.0 - 7.0 %  POCT glucose (manual entry)  Result Value Ref Range   POC Glucose 69 (A) 70 - 99 mg/dl  Currently on metformin 850 mg twice a day and Glucotrol XL 2.5 mg daily. Checks BS a few times a wk but gets anxious about it; gets to sweating. Last check 04/01/2023. Eating habits were bad but improved - not eating past 6 p.m and eating more salads Does a lot of walking daily on her job -10-20 mins intermittently through out work day.  Works at [employer]. Loss 10 lbs since 05/2022 Last [facility] exam 09/2022 at [facility]  In regards to her blood pressure, she is on lisinopril/hydrochlorothiazide 10/12.5 mg daily. Took already this a.m Limits salt Checks BP once a wk with automated arm cuff.  Last check 05/03/2023 and it was 122/70.  HL: On Zocor 40 mg daily.  HM:  due for c-scope next yr.  Last with [facility].  Had pap with [facility] gynecologist.  Had flu shot already.  Deferred on getting Shingrix until next visit.  Patient Active Problem List    Diagnosis Date Noted   Encounter for screening for other metabolic disorders 06/26/2022   Hyperlipidemia associated with type 2 diabetes mellitus (HCC) 06/26/2022   Hypertension associated with type 2 diabetes mellitus (HCC) 06/26/2022   Type 2 diabetes mellitus (HCC) 06/26/2022     Current Outpatient Medications on File Prior to Visit  Medication Sig Dispense Refill   ACCU-CHEK GUIDE test strip CHECK GLUCOSE IN VITRO ONCE A DAY     Accu-Chek Softclix Lancets lancets SMARTSIG:Topical     albuterol (VENTOLIN HFA) 108 (90 Base) MCG/ACT inhaler      No current facility-administered medications on file prior to visit.    Allergies  Allergen Reactions   Bee Venom Shortness Of Breath and Swelling   Fish Allergy Anaphylaxis, Shortness Of Breath and Swelling    Social History   Socioeconomic History   Marital status: Divorced    Spouse name: Not on file   Number of children: 2   Years of education: Not on file   Highest education level: Not on file  Occupational History   Not on file  Tobacco Use   Smoking status: Never   Smokeless tobacco: Never  Vaping Use   Vaping status: Never Used  Substance and Sexual Activity   Alcohol use: Not Currently   Drug use: Never  Sexual activity: Not on file  Other Topics Concern   Not on file  Social History Narrative   Son is deceased   Social Determinants of Health   Financial Resource Strain: Not on file  Food Insecurity: Not on file  Transportation Needs: Not on file  Physical Activity: Not on file  Stress: Not on file  Social Connections: Not on file  Intimate Partner Violence: Not on file    Family History  Problem Relation Age of Onset   Breast cancer Paternal Aunt        unsure of age    Past Surgical History:  Procedure Laterality Date   CESAREAN SECTION  06/15/1984    ROS: Review of Systems Negative except as stated above  PHYSICAL EXAM: BP 137/80 (BP Location: Left Arm, Patient Position: Sitting, Cuff  Size: Large)   Pulse 78   Temp 98.3 F (36.8 C) (Oral)   Ht 5\' 5"  (1.651 m)   Wt 231 lb (104.8 kg)   SpO2 96%   BMI 38.44 kg/m  Wt Readings from Last 3 Encounters:  05/05/23 231 lb (104.8 kg)  06/26/22 241 lb (109.3 kg)  BP 138/81   Physical Exam  General appearance - alert, well appearing, obese older AAF and in no distress Mental status - normal mood, behavior, speech, dress, motor activity, and thought processes Eyes - pupils equal and reactive, extraocular eye movements intact Mouth - mucous membranes moist, pharynx normal without lesions Neck - supple, no significant adenopathy Chest - clear to auscultation, no wheezes, rales or rhonchi, symmetric air entry Heart - normal rate, regular rhythm, normal S1, S2, no murmurs, rubs, clicks or gallops Extremities - peripheral pulses normal, no pedal edema, no clubbing or cyanosis Diabetic Foot Exam - Simple   Simple Foot Form Diabetic Foot exam was performed with the following findings: Yes 05/05/2023 10:12 AM  Visual Inspection See comments: Yes Sensation Testing Intact to touch and monofilament testing bilaterally: Yes Pulse Check Posterior Tibialis and Dorsalis pulse intact bilaterally: Yes Comments Small callus on the plantar medial aspect of both big toes.          No data to display         ASSESSMENT AND PLAN: 1. Establishing care with new doctor, encounter for   2. Type 2 diabetes mellitus with obesity (HCC) A1c at goal.  BS low; pt reports she is fasting today Continue metformin 850 mg twice a day.  Discontinue glipizide 2.5 mg daily.  I initially had added Jardiance 10 mg before seeing her A1c today which was 5.6.  However after seeing it, I told her that we do not need to add the Jardiance.  Continue to remain active. Patient advised to eliminate sugary drinks from the diet, cut back on portion sizes especially of white carbohydrates, eat more white lean meat like chicken [place] and seafood instead of beef  or pork and incorporate fresh fruits and vegetables into the diet daily. -Will send prescription to her pharmacy for continuous glucose monitor to see if it would be covered by her insurance. - POCT glycosylated hemoglobin (Hb A1C) - POCT glucose (manual entry) - metFORMIN (GLUCOPHAGE) 850 MG tablet; Take 1 tablet (850 mg total) by mouth 2 (two) times daily with a meal.  Dispense: 180 tablet; Refill: 1 - CBC - Comprehensive metabolic panel - Microalbumin / creatinine urine ratio - Continuous Glucose Sensor (FREESTYLE LIBRE 3 SENSOR) MISC; Change Q 2 weeks  Dispense: 2 each; Refill: 6 - Continuous Glucose Receiver (  FREESTYLE LIBRE 3 READER) DEVI; 1 Device by Does not apply route daily.  Dispense: 1 each; Refill: 0 - empagliflozin (JARDIANCE) 10 MG TABS tablet; Take 1 tablet (10 mg total) by mouth daily before breakfast.  Dispense: 90 tablet; Refill: 1  3. Diabetes mellitus treated with oral medication (HCC) See #2 above  4. Hypertension associated with type 2 diabetes mellitus (HCC) Not at goal.  However she reports good home blood pressure readings.  Advised patient to check blood pressure twice a week and record the readings.  Will have her follow-up with clinical pharmacist in several weeks.  Advised to bring her readings with her.  Continue lisinopril/HCTZ 10/12.5 mg daily. - lisinopril-hydrochlorothiazide (ZESTORETIC) 10-12.5 MG tablet; Take 1 tablet by mouth daily.  Dispense: 90 tablet; Refill: 3  5. Hyperlipidemia associated with type 2 diabetes mellitus (HCC) Continue Zocor.  Check lipid profile and LFTs today. - simvastatin (ZOCOR) 40 MG tablet; SMARTSIG:1 Tablet(s) By Mouth Every Evening  Dispense: 90 tablet; Refill: 1 - Lipid panel  I have submitted request to get her last diabetic eye exam, Pap smear report and colonoscopy report.   Patient was given the opportunity to ask questions.  Patient verbalized understanding of the plan and was able to repeat key elements of the plan.    This documentation was completed using Paediatric nurse.  Any transcriptional errors are unintentional.  Orders Placed This Encounter  Procedures   CBC   Comprehensive metabolic panel   Lipid panel   Microalbumin / creatinine urine ratio   POCT glycosylated hemoglobin (Hb A1C)   POCT glucose (manual entry)     Requested Prescriptions   Signed Prescriptions Disp Refills   metFORMIN (GLUCOPHAGE) 850 MG tablet 180 tablet 1    Sig: Take 1 tablet (850 mg total) by mouth 2 (two) times daily with a meal.   simvastatin (ZOCOR) 40 MG tablet 90 tablet 1    Sig: SMARTSIG:1 Tablet(s) By Mouth Every Evening   lisinopril-hydrochlorothiazide (ZESTORETIC) 10-12.5 MG tablet 90 tablet 3    Sig: Take 1 tablet by mouth daily.   Continuous Glucose Sensor (FREESTYLE LIBRE 3 SENSOR) MISC 2 each 6    Sig: Change Q 2 weeks   Continuous Glucose Receiver (FREESTYLE LIBRE 3 READER) DEVI 1 each 0    Sig: 1 Device by Does not apply route daily.    Return in about 4 months (around 09/05/2023) for Sign release to get colonsopy report from [facility], Appt with [facility] in 3 wks for BP check.  Jonah Blue, MD, FACP

## 2023-05-06 LAB — COMPREHENSIVE METABOLIC PANEL
ALT: 18 [IU]/L (ref 0–32)
AST: 20 [IU]/L (ref 0–40)
Albumin: 4.6 g/dL (ref 3.8–4.9)
Alkaline Phosphatase: 41 [IU]/L — ABNORMAL LOW (ref 44–121)
BUN/Creatinine Ratio: 11 (ref 9–23)
BUN: 7 mg/dL (ref 6–24)
Bilirubin Total: 0.4 mg/dL (ref 0.0–1.2)
CO2: 21 mmol/L (ref 20–29)
Calcium: 10.7 mg/dL — ABNORMAL HIGH (ref 8.7–10.2)
Chloride: 102 mmol/L (ref 96–106)
Creatinine, Ser: 0.65 mg/dL (ref 0.57–1.00)
Globulin, Total: 2.1 g/dL (ref 1.5–4.5)
Glucose: 63 mg/dL — ABNORMAL LOW (ref 70–99)
Potassium: 4.5 mmol/L (ref 3.5–5.2)
Sodium: 140 mmol/L (ref 134–144)
Total Protein: 6.7 g/dL (ref 6.0–8.5)
eGFR: 101 mL/min/{1.73_m2} (ref >59)

## 2023-05-06 LAB — CBC
Hematocrit: 41.2 % (ref 34.0–46.6)
Hemoglobin: 12.7 g/dL (ref 11.1–15.9)
MCH: 28.2 pg (ref 26.6–33.0)
MCHC: 30.8 g/dL — ABNORMAL LOW (ref 31.5–35.7)
MCV: 92 fL (ref 79–97)
Platelets: 229 10*3/uL (ref 150–450)
RBC: 4.5 x10E6/uL (ref 3.77–5.28)
RDW: 13.1 % (ref 11.7–15.4)
WBC: 7.4 10*3/uL (ref 3.4–10.8)

## 2023-05-06 LAB — LIPID PANEL
Chol/HDL Ratio: 3.1 {ratio} (ref 0.0–4.4)
Cholesterol, Total: 151 mg/dL (ref 100–199)
HDL: 49 mg/dL (ref >39)
LDL Chol Calc (NIH): 72 mg/dL (ref 0–99)
Triglycerides: 179 mg/dL — ABNORMAL HIGH (ref 0–149)
VLDL Cholesterol Cal: 30 mg/dL (ref 5–40)

## 2023-05-07 ENCOUNTER — Other Ambulatory Visit: Payer: Self-pay | Admitting: Internal Medicine

## 2023-05-07 LAB — MICROALBUMIN / CREATININE URINE RATIO
Creatinine, Urine: 30.8 mg/dL
Microalb/Creat Ratio: 31 mg/g{creat} — ABNORMAL HIGH (ref 0–29)
Microalbumin, Urine: 9.5 ug/mL

## 2023-05-10 ENCOUNTER — Encounter: Payer: Self-pay | Admitting: Internal Medicine

## 2023-05-12 ENCOUNTER — Ambulatory Visit: Payer: BC Managed Care – PPO | Attending: Internal Medicine

## 2023-05-14 ENCOUNTER — Encounter: Payer: Self-pay | Admitting: Internal Medicine

## 2023-05-14 NOTE — Progress Notes (Signed)
I received patient's Pap smear report from [facility] Dr. Gerald Leitz.  Last Pap smear was done 08/11/2017.  Cytology and HPV screening were negative.

## 2023-05-20 LAB — PTH, INTACT AND CALCIUM
Calcium: 10.4 mg/dL — ABNORMAL HIGH (ref 8.7–10.2)
PTH: 18 pg/mL (ref 15–65)

## 2023-05-20 LAB — PTH-RELATED PEPTIDE: PTH-related peptide: <2 pmol/L

## 2023-05-20 LAB — VITAMIN D 25 HYDROXY (VIT D DEFICIENCY, FRACTURES): Vit D, 25-Hydroxy: 64.7 ng/mL (ref 30.0–100.0)

## 2023-05-20 LAB — TSH: TSH: 2.44 u[IU]/mL (ref 0.450–4.500)

## 2023-06-01 NOTE — Progress Notes (Signed)
S:     No chief complaint on file.  57 y.o. female who presents for hypertension evaluation, education, and management.  PMH is significant for HTN and T2DM.  Patient was referred and last seen by Primary Care Provider, Dr. Laural Benes, on 05/05/23 to establish care.  At last visit, BP was elevated 137/80. Patient reported checking BP at home with automated arm cuff. Recalled reading of 122/70 and reported adherence to lisinopril/hydrochlorothiazide. Dr. Laural Benes advised her to check her BP at home 2x weekly and record readings to bring to f/u visit with pharmacist. Calcium was elevated on 10/7 and f/u labs were completed to determine underlying cause. These labs were not significant and calcium decreased to 10.4. Elevated calcium likely due to hydrochlorothiazide.  Today, patient arrives in spirits and presents without assistance. Denies dizziness, headache, blurred vision, swelling, chest pain.  Family/Social history:  Never smoker  Medication adherence appears optimal. Patient has taken BP medications today.   Current antihypertensives include: lisinopril/hydrochlorothiazide 10-12.5 mg daily  Patient reports adherence to medications as prescribed.  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 minutes, works setting up tractor trailers  ASCVD risk factors include: HTN, T2DM  O:  Vitals:   06/02/23 0918  BP: 131/74  Pulse: 80    Last 3 Office BP readings: BP Readings from Last 3 Encounters:  06/02/23 131/74  05/05/23 137/80  06/26/22 (!) 147/84    BMET    Component Value Date/Time   NA 140 05/05/2023 1021   K 4.5 05/05/2023 1021   CL 102 05/05/2023 1021   CO2 21  05/05/2023 1021   GLUCOSE 63 (L) 05/05/2023 1021   BUN 7 05/05/2023 1021   CREATININE 0.65 05/05/2023 1021   CALCIUM 10.4 (H) 05/12/2023 0956    Renal function: CrCl cannot be calculated (Patient's most recent lab result is older than the maximum 21 days allowed.).  Clinical ASCVD: No  The 10-year ASCVD risk score (Arnett DK, et al., 2019) is: 13.2%   Values used to calculate the score:     Age: 38 years     Sex: Female     Is Non-Hispanic African American: Yes     Diabetic: Yes     Tobacco smoker: No     Systolic Blood Pressure: 131 mmHg     Is BP treated: Yes     HDL Cholesterol: 49 mg/dL     Total Cholesterol: 151 mg/dL  Patient is participating in a Managed Medicaid Plan:  No   A/P: Hypertension diagnosed currently close to goal based on clinic BP and at goal based on home BP readings. BP goal < 130/80 mmHg. Medication adherence appears optimal. Control is subotimal due to need for medication optimization. Due to elevated calcium with no other clear cause and risk of angioedema with ACE inhibitor in African American patient, will discontinue lisinopril/hydrochlorothiazide and start valsartan. Patient likely will be able to reach goal BP on optimized dose of single agent valsartan. Started conservatively with valsartan 80 mg since BP today is essentially at goal. -Discontinued lisinopril/hydrochlorothiazide 10-12.5 mg daily. Last dose this AM.  -Started valsartan 80 mg daily. Instructed patient to hold antihypertensives tomorrow then start valsartan on AM of 11/6 since  she prefers to take medication in the morning. -Patient educated on purpose, proper use, and potential adverse effects of valsartan.  -F/u labs ordered - check BMP at next visit -Counseled on lifestyle modifications for blood pressure control including reduced dietary sodium, increased exercise, adequate sleep. -Encouraged patient to check BP at home and bring log of readings to next visit. Counseled on proper use  of home BP cuff.   Results reviewed and written information provided.    Written patient instructions provided. Patient verbalized understanding of treatment plan.  Total time in face to face counseling 30 minutes.    Follow-up:  Pharmacist in 1 month. PCP clinic visit in February with Dr. Laural Benes.   Patient seen with Nicole Kindred, PGY-1 Pharmacy Resident  Jarrett Ables, PharmD PGY-1 Pharmacy Resident

## 2023-06-02 ENCOUNTER — Ambulatory Visit: Payer: BC Managed Care – PPO | Attending: Internal Medicine | Admitting: Pharmacist

## 2023-06-02 ENCOUNTER — Encounter: Payer: Self-pay | Admitting: Pharmacist

## 2023-06-02 VITALS — BP 131/74 | HR 80

## 2023-06-02 DIAGNOSIS — Z7984 Long term (current) use of oral hypoglycemic drugs: Secondary | ICD-10-CM

## 2023-06-02 DIAGNOSIS — I152 Hypertension secondary to endocrine disorders: Secondary | ICD-10-CM

## 2023-06-02 DIAGNOSIS — E1159 Type 2 diabetes mellitus with other circulatory complications: Secondary | ICD-10-CM

## 2023-06-02 MED ORDER — VALSARTAN 80 MG PO TABS: 80.0000 mg | ORAL_TABLET | Freq: Every day | ORAL | 3 refills | Status: DC

## 2023-06-10 ENCOUNTER — Telehealth: Payer: BC Managed Care – PPO | Admitting: Physician Assistant

## 2023-06-10 DIAGNOSIS — B9689 Other specified bacterial agents as the cause of diseases classified elsewhere: Secondary | ICD-10-CM | POA: Diagnosis not present

## 2023-06-10 DIAGNOSIS — J019 Acute sinusitis, unspecified: Secondary | ICD-10-CM

## 2023-06-10 MED ORDER — FLUTICASONE PROPIONATE 50 MCG/ACT NA SUSP
2.0000 | Freq: Every day | NASAL | 0 refills | Status: AC
Start: 2023-06-10 — End: ?

## 2023-06-10 MED ORDER — AMOXICILLIN-POT CLAVULANATE 875-125 MG PO TABS
1.0000 | ORAL_TABLET | Freq: Two times a day (BID) | ORAL | 0 refills | Status: DC
Start: 2023-06-10 — End: 2023-09-08

## 2023-06-10 NOTE — Progress Notes (Signed)
Virtual Visit Consent   Kara Gray, you are scheduled for a virtual visit with a [facility] provider today. Just as with appointments in the office, your consent must be obtained to participate. Your consent will be active for this visit and any virtual visit you may have with one of our providers in the next 365 days. If you have a MyChart account, a copy of this consent can be sent to you electronically.  As this is a virtual visit, video technology does not allow for your provider to perform a traditional examination. This may limit your provider's ability to fully assess your condition. If your provider identifies any concerns that need to be evaluated in person or the need to arrange testing (such as labs, EKG, etc.), we will make arrangements to do so. Although advances in technology are sophisticated, we cannot ensure that it will always work on either your end or our end. If the connection with a video visit is poor, the visit may have to be switched to a telephone visit. With either a video or telephone visit, we are not always able to ensure that we have a secure connection.  By engaging in this virtual visit, you consent to the provision of healthcare and authorize for your insurance to be billed (if applicable) for the services provided during this visit. Depending on your insurance coverage, you may receive a charge related to this service.  I need to obtain your verbal consent now. Are you willing to proceed with your visit today? Kara Gray has provided verbal consent on 06/10/2023 for a virtual visit (video or telephone). Piedad Climes, [place]  Date: 06/10/2023 7:49 AM  Virtual Visit via Video Note   I, Piedad Climes, connected with  Kara Gray  ([id number], February 08, 1966) on 06/10/23 at  7:45 AM EST by a video-enabled telemedicine application and verified that I am speaking with the correct person using two identifiers.  Location: Patient: Virtual Visit  Location Patient: Home Provider: Virtual Visit Location Provider: Home Office   I discussed the limitations of evaluation and management by telemedicine and the availability of in person appointments. The patient expressed understanding and agreed to proceed.    History of Present Illness: Kara Gray is a 57 y.o. who identifies as a female who was assigned female at birth, and is being seen today for sinus symptoms ongoing for the past > 1 week with nasal and sinus congestion, post nasal drip. Symptoms have worsened over this past weekend with substantial sinus pain and facial tenderness with continued sinus headache. Now with cough from level of PND, worse at nighttime. Has tested for COVID twice as a precaution and these were negative. Denies recent travel or known sick contact.   OTC -- none -- was hesitant due to her DM and HTN.  HPI: HPI  Problems:  Patient Active Problem List   Diagnosis Date Noted   Encounter for screening for other metabolic disorders 06/26/2022   Hyperlipidemia associated with type 2 diabetes mellitus (HCC) 06/26/2022   Hypertension associated with type 2 diabetes mellitus (HCC) 06/26/2022   Type 2 diabetes mellitus (HCC) 06/26/2022    Allergies:  Allergies  Allergen Reactions   Bee Venom Shortness Of Breath and Swelling   Fish Allergy Anaphylaxis, Shortness Of Breath and Swelling   Medications:  Current Outpatient Medications:    amoxicillin-clavulanate (AUGMENTIN) 875-125 MG tablet, Take 1 tablet by mouth 2 (two) times daily., Disp: 14 tablet, Rfl: 0  fluticasone (FLONASE) 50 MCG/ACT nasal spray, Place 2 sprays into both nostrils daily., Disp: 16 g, Rfl: 0   ACCU-CHEK GUIDE test strip, CHECK GLUCOSE IN VITRO ONCE A DAY, Disp: , Rfl:    Accu-Chek Softclix Lancets lancets, SMARTSIG:Topical, Disp: , Rfl:    albuterol (VENTOLIN HFA) 108 (90 Base) MCG/ACT inhaler, , Disp: , Rfl:    metFORMIN (GLUCOPHAGE) 850 MG tablet, Take 1 tablet (850 mg total) by mouth  2 (two) times daily with a meal., Disp: 180 tablet, Rfl: 1   simvastatin (ZOCOR) 40 MG tablet, SMARTSIG:1 Tablet(s) By Mouth Every Evening, Disp: 90 tablet, Rfl: 1   valsartan (DIOVAN) 80 MG tablet, Take 1 tablet (80 mg total) by mouth daily., Disp: 90 tablet, Rfl: 3  Observations/Objective: Patient is well-developed, well-nourished in no acute distress.  Resting comfortably at home.  Head is normocephalic, atraumatic.  No labored breathing. Speech is clear and coherent with logical content.  Patient is alert and oriented at baseline.   Assessment and Plan: 1. Acute bacterial sinusitis - amoxicillin-clavulanate (AUGMENTIN) 875-125 MG tablet; Take 1 tablet by mouth 2 (two) times daily.  Dispense: 14 tablet; Refill: 0 - fluticasone (FLONASE) 50 MCG/ACT nasal spray; Place 2 sprays into both nostrils daily.  Dispense: 16 g; Refill: 0  Rx Augmentin.  Increase fluids.  Rest.  Saline nasal spray.  Probiotic.  Mucinex as directed.  Humidifier in bedroom. Coricidin HBP OTC. Flonase per orders.  Call or return to clinic if symptoms are not improving.   Follow Up Instructions: I discussed the assessment and treatment plan with the patient. The patient was provided an opportunity to ask questions and all were answered. The patient agreed with the plan and demonstrated an understanding of the instructions.  A copy of instructions were sent to the patient via MyChart unless otherwise noted below.   The patient was advised to call back or seek an in-person evaluation if the symptoms worsen or if the condition fails to improve as anticipated.    Piedad Climes, PA-C

## 2023-06-10 NOTE — Patient Instructions (Signed)
[name], thank you for joining Kara Climes, PA-C for today's virtual visit.  While this provider is not your primary care provider (PCP), if your PCP is located in our provider database this encounter information will be shared with them immediately following your visit.   A [facility] MyChart account gives you access to today's visit and all your visits, tests, and labs performed at [facility] " click here if you don't have a [facility] MyChart account or go to [email or web address]  Consent: (Patient) [name] provided verbal consent for this virtual visit at the beginning of the encounter.  Current Medications:  Current Outpatient Medications:    ACCU-CHEK GUIDE test strip, CHECK GLUCOSE IN VITRO ONCE A DAY, Disp: , Rfl:    Accu-Chek Softclix Lancets lancets, SMARTSIG:Topical, Disp: , Rfl:    albuterol (VENTOLIN HFA) 108 (90 Base) MCG/ACT inhaler, , Disp: , Rfl:    Continuous Glucose Receiver (FREESTYLE LIBRE 3 READER) DEVI, 1 Device by Does not apply route daily., Disp: 1 each, Rfl: 0   Continuous Glucose Sensor (FREESTYLE LIBRE 3 SENSOR) MISC, Change Q 2 weeks, Disp: 2 each, Rfl: 6   metFORMIN (GLUCOPHAGE) 850 MG tablet, Take 1 tablet (850 mg total) by mouth 2 (two) times daily with a meal., Disp: 180 tablet, Rfl: 1   simvastatin (ZOCOR) 40 MG tablet, SMARTSIG:1 Tablet(s) By Mouth Every Evening, Disp: 90 tablet, Rfl: 1   valsartan (DIOVAN) 80 MG tablet, Take 1 tablet (80 mg total) by mouth daily., Disp: 90 tablet, Rfl: 3   Medications ordered in this encounter:  No orders of the defined types were placed in this encounter.    *If you need refills on other medications prior to your next appointment, please contact your pharmacy*  Follow-Up: Call back or seek an in-person evaluation if the symptoms worsen or if the condition fails to improve as anticipated.  [facility] [phone]  Other Instructions Please take antibiotic  as directed.  Increase fluid intake.  Use Saline nasal spray.  Take a daily multivitamin. You can take Coricidin HBP over-the-counter to help with your active symptoms. Use the Flonase as directed.  Place a humidifier in the bedroom.  Please call or return clinic if symptoms are not improving.  Sinusitis Sinusitis is redness, soreness, and swelling (inflammation) of the paranasal sinuses. Paranasal sinuses are air pockets within the bones of your face (beneath the eyes, the middle of the forehead, or above the eyes). In healthy paranasal sinuses, mucus is able to drain out, and air is able to circulate through them by way of your nose. However, when your paranasal sinuses are inflamed, mucus and air can become trapped. This can allow bacteria and other germs to grow and cause infection. Sinusitis can develop quickly and last only a short time (acute) or continue over a long period (chronic). Sinusitis that lasts for more than 12 weeks is considered chronic.  CAUSES  Causes of sinusitis include: Allergies. Structural abnormalities, such as displacement of the cartilage that separates your nostrils (deviated septum), which can decrease the air flow through your nose and sinuses and affect sinus drainage. Functional abnormalities, such as when the small hairs (cilia) that line your sinuses and help remove mucus do not work properly or are not present. SYMPTOMS  Symptoms of acute and chronic sinusitis are the same. The primary symptoms are pain and pressure around the affected sinuses. Other symptoms include: Upper toothache. Earache. Headache. Bad breath. Decreased sense of smell  and taste. A cough, which worsens when you are lying flat. Fatigue. Fever. Thick drainage from your nose, which often is green and may contain pus (purulent). Swelling and warmth over the affected sinuses. DIAGNOSIS  Your caregiver will perform a physical exam. During the exam, your caregiver may: Look in your nose for  signs of abnormal growths in your nostrils (nasal polyps). Tap over the affected sinus to check for signs of infection. View the inside of your sinuses (endoscopy) with a special imaging device with a light attached (endoscope), which is inserted into your sinuses. If your caregiver suspects that you have chronic sinusitis, one or more of the following tests may be recommended: Allergy tests. Nasal culture A sample of mucus is taken from your nose and sent to a lab and screened for bacteria. Nasal cytology A sample of mucus is taken from your nose and examined by your caregiver to determine if your sinusitis is related to an allergy. TREATMENT  Most cases of acute sinusitis are related to a viral infection and will resolve on their own within 10 days. Sometimes medicines are prescribed to help relieve symptoms (pain medicine, decongestants, nasal steroid sprays, or saline sprays).  However, for sinusitis related to a bacterial infection, your caregiver will prescribe antibiotic medicines. These are medicines that will help kill the bacteria causing the infection.  Rarely, sinusitis is caused by a fungal infection. In theses cases, your caregiver will prescribe antifungal medicine. For some cases of chronic sinusitis, surgery is needed. Generally, these are cases in which sinusitis recurs more than 3 times per year, despite other treatments. HOME CARE INSTRUCTIONS  Drink plenty of water. Water helps thin the mucus so your sinuses can drain more easily. Use a humidifier. Inhale steam 3 to 4 times a day (for example, sit in the bathroom with the shower running). Apply a warm, moist washcloth to your face 3 to 4 times a day, or as directed by your caregiver. Use saline nasal sprays to help moisten and clean your sinuses. Take over-the-counter or prescription medicines for pain, discomfort, or fever only as directed by your caregiver. SEEK IMMEDIATE MEDICAL CARE IF: You have increasing pain or severe  headaches. You have nausea, vomiting, or drowsiness. You have swelling around your face. You have vision problems. You have a stiff neck. You have difficulty breathing. MAKE SURE YOU:  Understand these instructions. Will watch your condition. Will get help right away if you are not doing well or get worse. Document Released: 07/15/2005 Document Revised: 10/07/2011 Document Reviewed: 07/30/2011 [facility] Patient Information 2014 [employer] [place], [place].    If you have been instructed to have an in-person evaluation today at a local Urgent Care facility, please use the link below. It will take you to a list of all of our available [facility], including address, phone number and hours of operation. Please do not delay care.  [facility]  If you or a family member do not have a primary care provider, use the link below to schedule a visit and establish care. When you choose a [facility] primary care physician or advanced practice provider, you gain a long-term partner in health. Find a Primary Care Provider  Learn more about [facility]'s in-office and virtual care options:  - Get Care Now

## 2023-07-04 ENCOUNTER — Other Ambulatory Visit: Payer: Self-pay | Admitting: Internal Medicine

## 2023-07-04 ENCOUNTER — Encounter: Payer: Self-pay | Admitting: Internal Medicine

## 2023-07-04 DIAGNOSIS — E1169 Type 2 diabetes mellitus with other specified complication: Secondary | ICD-10-CM

## 2023-07-04 MED ORDER — SIMVASTATIN 40 MG PO TABS: ORAL_TABLET | ORAL | 1 refills | Status: DC

## 2023-07-07 ENCOUNTER — Ambulatory Visit: Payer: BC Managed Care – PPO | Attending: Family Medicine | Admitting: Pharmacist

## 2023-07-07 ENCOUNTER — Encounter: Payer: Self-pay | Admitting: Pharmacist

## 2023-07-07 VITALS — BP 129/82 | HR 80

## 2023-07-07 DIAGNOSIS — I152 Hypertension secondary to endocrine disorders: Secondary | ICD-10-CM | POA: Diagnosis not present

## 2023-07-07 DIAGNOSIS — Z7984 Long term (current) use of oral hypoglycemic drugs: Secondary | ICD-10-CM

## 2023-07-07 DIAGNOSIS — E1159 Type 2 diabetes mellitus with other circulatory complications: Secondary | ICD-10-CM | POA: Diagnosis not present

## 2023-07-07 NOTE — Progress Notes (Signed)
   S:     No chief complaint on file.  57 y.o. female who presents for hypertension evaluation, education, and management.  PMH is significant for HTN and T2DM.  Patient was referred and last seen by Primary Care Provider, Dr. Laural Benes, on 05/05/23 to establish care. Pharmacy saw her on 06/02/2023 and BP showed great improvement at 131/74 mmHg. However, we stopped the combination valsartan-hydrochlorothiazide due to the hydrochlorothiazide component and a recent mild hypercalcemia. We started single agent valsartan instead.   Today, patient arrives in spirits and presents without assistance. Denies dizziness, headache, blurred vision, swelling, chest pain. She stopped the combination valsartan-hydrochlorothiazide tab and is now taking valsartan monotherapy. No side effects with the valsartan.  Family/Social history:  Never smoker  Medication adherence appears optimal. Patient has taken BP medications today.  Current antihypertensives include: valsartan 80 mg daily  Patient reports adherence to medications as prescribed.  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, works setting up tractor trailers  ASCVD risk factors include: HTN, T2DM  O:  Vitals:   07/07/23 0922  BP: 129/82  Pulse: 80   Last 3 Office BP readings: BP Readings from Last 3 Encounters:  07/07/23 129/82  06/02/23 131/74  05/05/23 137/80   BMET    Component Value Date/Time   NA 140 05/05/2023 1021   K 4.5 05/05/2023 1021   CL 102 05/05/2023 1021   CO2 21 05/05/2023 1021   GLUCOSE 63 (L) 05/05/2023 1021   BUN 7 05/05/2023 1021   CREATININE 0.65 05/05/2023 1021   CALCIUM 10.4 (H) 05/12/2023 0956   Renal function: CrCl cannot be calculated (Patient's most recent lab result is older than the maximum 21 days allowed.).  Clinical ASCVD: No  The 10-year ASCVD risk score (Arnett  DK, et al., 2019) is: 12.6%   Values used to calculate the score:     Age: 88 years     Sex: Female     Is Non-Hispanic African American: Yes     Diabetic: Yes     Tobacco smoker: No     Systolic Blood Pressure: 129 mmHg     Is BP treated: Yes     HDL Cholesterol: 49 mg/dL     Total Cholesterol: 151 mg/dL  Patient is participating in a Managed Medicaid Plan:  No  A/P: Hypertension diagnosed currently close to goal. BP goal < 130/80 mmHg. Medication adherence appears optimal.  -Continue valsartan 80 mg daily. -Patient educated on purpose, proper use, and potential adverse effects of valsartan.  -F/u labs ordered - patient is not fasting. I have entered orders for a CMP-14+eGFR and she agrees to return to clinic one day later this week or next when she's fasting.  -Counseled on lifestyle modifications for blood pressure control including reduced dietary sodium, increased exercise, adequate sleep. -Encouraged patient to check BP at home and bring log of readings to next visit. Counseled on proper use of home BP cuff.   Results reviewed and written information provided.    Written patient instructions provided. Patient verbalized understanding of treatment plan.  Total time in face to face counseling 30 minutes.    Follow-up:  Labs this week or next when fasting. PCP clinic visit in February with Dr. Laural Benes.   Butch Penny, PharmD, P[place]y [place], CPP Clinical Pharmacist [facility] [phone]

## 2023-07-14 ENCOUNTER — Ambulatory Visit: Payer: BC Managed Care – PPO | Attending: Family Medicine

## 2023-07-14 DIAGNOSIS — I152 Hypertension secondary to endocrine disorders: Secondary | ICD-10-CM

## 2023-07-14 DIAGNOSIS — E1159 Type 2 diabetes mellitus with other circulatory complications: Secondary | ICD-10-CM | POA: Diagnosis not present

## 2023-07-15 ENCOUNTER — Encounter: Payer: Self-pay | Admitting: Internal Medicine

## 2023-07-15 ENCOUNTER — Other Ambulatory Visit: Payer: Self-pay | Admitting: Internal Medicine

## 2023-07-15 DIAGNOSIS — E875 Hyperkalemia: Secondary | ICD-10-CM

## 2023-07-15 LAB — CMP14+EGFR
ALT: 14 [IU]/L (ref 0–32)
AST: 19 [IU]/L (ref 0–40)
Albumin: 4.4 g/dL (ref 3.8–4.9)
Alkaline Phosphatase: 51 [IU]/L (ref 44–121)
BUN/Creatinine Ratio: 14 (ref 9–23)
BUN: 9 mg/dL (ref 6–24)
Bilirubin Total: 0.5 mg/dL (ref 0.0–1.2)
CO2: 21 mmol/L (ref 20–29)
Calcium: 10.3 mg/dL — ABNORMAL HIGH (ref 8.7–10.2)
Chloride: 105 mmol/L (ref 96–106)
Creatinine, Ser: 0.65 mg/dL (ref 0.57–1.00)
Globulin, Total: 2.2 g/dL (ref 1.5–4.5)
Glucose: 151 mg/dL — ABNORMAL HIGH (ref 70–99)
Potassium: 5.6 mmol/L — ABNORMAL HIGH (ref 3.5–5.2)
Sodium: 142 mmol/L (ref 134–144)
Total Protein: 6.6 g/dL (ref 6.0–8.5)
eGFR: 101 mL/min/{1.73_m2} (ref >59)

## 2023-07-25 ENCOUNTER — Ambulatory Visit: Payer: BC Managed Care – PPO | Attending: Internal Medicine

## 2023-07-25 ENCOUNTER — Encounter: Payer: Self-pay | Admitting: Internal Medicine

## 2023-07-25 DIAGNOSIS — E875 Hyperkalemia: Secondary | ICD-10-CM | POA: Diagnosis not present

## 2023-07-26 ENCOUNTER — Encounter: Payer: Self-pay | Admitting: Internal Medicine

## 2023-07-26 LAB — BASIC METABOLIC PANEL
BUN/Creatinine Ratio: 12 (ref 9–23)
BUN: 8 mg/dL (ref 6–24)
CO2: 22 mmol/L (ref 20–29)
Calcium: 9.9 mg/dL (ref 8.7–10.2)
Chloride: 105 mmol/L (ref 96–106)
Creatinine, Ser: 0.68 mg/dL (ref 0.57–1.00)
Glucose: 107 mg/dL — ABNORMAL HIGH (ref 70–99)
Potassium: 4.9 mmol/L (ref 3.5–5.2)
Sodium: 142 mmol/L (ref 134–144)
eGFR: 100 mL/min/{1.73_m2} (ref >59)

## 2023-08-14 ENCOUNTER — Other Ambulatory Visit: Payer: Self-pay | Admitting: Internal Medicine

## 2023-08-14 DIAGNOSIS — E669 Obesity, unspecified: Secondary | ICD-10-CM

## 2023-08-14 NOTE — Telephone Encounter (Signed)
Refused metformin because it's being requested too soon.

## 2023-09-08 ENCOUNTER — Ambulatory Visit: Payer: BC Managed Care – PPO | Attending: Internal Medicine | Admitting: Internal Medicine

## 2023-09-08 ENCOUNTER — Encounter: Payer: Self-pay | Admitting: Internal Medicine

## 2023-09-08 VITALS — BP 145/80 | HR 82 | Ht 65.0 in | Wt 234.0 lb

## 2023-09-08 DIAGNOSIS — Z1211 Encounter for screening for malignant neoplasm of colon: Secondary | ICD-10-CM

## 2023-09-08 DIAGNOSIS — Z6838 Body mass index (BMI) 38.0-38.9, adult: Secondary | ICD-10-CM

## 2023-09-08 DIAGNOSIS — I152 Hypertension secondary to endocrine disorders: Secondary | ICD-10-CM

## 2023-09-08 DIAGNOSIS — Z23 Encounter for immunization: Secondary | ICD-10-CM | POA: Diagnosis not present

## 2023-09-08 DIAGNOSIS — Z596 Low income: Secondary | ICD-10-CM

## 2023-09-08 DIAGNOSIS — E1169 Type 2 diabetes mellitus with other specified complication: Secondary | ICD-10-CM

## 2023-09-08 DIAGNOSIS — E785 Hyperlipidemia, unspecified: Secondary | ICD-10-CM

## 2023-09-08 DIAGNOSIS — E1159 Type 2 diabetes mellitus with other circulatory complications: Secondary | ICD-10-CM | POA: Diagnosis not present

## 2023-09-08 DIAGNOSIS — E119 Type 2 diabetes mellitus without complications: Secondary | ICD-10-CM

## 2023-09-08 DIAGNOSIS — E66812 Obesity, class 2: Secondary | ICD-10-CM

## 2023-09-08 DIAGNOSIS — E669 Obesity, unspecified: Secondary | ICD-10-CM

## 2023-09-08 DIAGNOSIS — Z7984 Long term (current) use of oral hypoglycemic drugs: Secondary | ICD-10-CM

## 2023-09-08 LAB — POCT GLYCOSYLATED HEMOGLOBIN (HGB A1C): HbA1c, POC (controlled diabetic range): 6.8 % (ref 0.0–7.0)

## 2023-09-08 LAB — GLUCOSE, POCT (MANUAL RESULT ENTRY): POC Glucose: 141 mg/dL — AB (ref 70–99)

## 2023-09-08 NOTE — Progress Notes (Signed)
 Patient ID: Kara Gray, female    DOB: Mar 20, 1966  MRN: [id number]  CC: Diabetes (DM f/u. Justina Oman referral for colonoscopy - pt reports that due is due every 5 yrs/Yes to tdap vax. Instructed to sign ROI for last pap in 2024. )   Subjective: Kara Gray is a 58 y.o. female who presents for chronic ds management. Her concerns today include:  Patient with history of DM type II, HTN, HL   HM:  request referral for c-scope; last 1 was 5 yrs ago, and 1 poly removed; told to repeat in 5 yrs. Yes tapt today; shingles vaccine later. Due for PCV vaccine; defers on this today.  DM:  Results for orders placed or performed in visit on 09/08/23  POCT glycosylated hemoglobin (Hb A1C)   Collection Time: 09/08/23  9:59 AM  Result Value Ref Range   Hemoglobin A1C     HbA1c POC (<> result, manual entry)     HbA1c, POC (prediabetic range)     HbA1c, POC (controlled diabetic range) 6.8 0.0 - 7.0 %  POCT glucose (manual entry)   Collection Time: 09/08/23 10:02 AM  Result Value Ref Range   POC Glucose 141 (A) 70 - 99 mg/dl  Taking Metformin  850 mg BID.  Checks BS a few times a wk. Range 123 Gets in last meal before 6 p.m. Sometimes she may have a snack after like peanuts, crackers or pretzel.  Drinks 4-5 cups of coffee at work.  Uses Splenda. Tries to walk daily at work except when it is raining.  Total about 45 mins.  Wgh at home is 230 lbs HTN: checks BP once a wk.  Yesterday 132/78 which is usual for her Took Diovan  80 mg.  Loves pretzel and salted nuts HL:  taking Zocor  40 mg daily Elev Ca level: repeat was nl, TSH/Vit D/PTH level normal. Patient Active Problem List   Diagnosis Date Noted   Encounter for screening for other metabolic disorders 06/26/2022   Hyperlipidemia associated with type 2 diabetes mellitus (HCC) 06/26/2022   Hypertension associated with type 2 diabetes mellitus (HCC) 06/26/2022   Type 2 diabetes mellitus (HCC) 06/26/2022     Current Outpatient Medications on  File Prior to Visit  Medication Sig Dispense Refill   ACCU-CHEK GUIDE test strip CHECK GLUCOSE IN VITRO ONCE A DAY     Accu-Chek Softclix Lancets lancets SMARTSIG:Topical     albuterol (VENTOLIN HFA) 108 (90 Base) MCG/ACT inhaler      fluticasone  (FLONASE ) 50 MCG/ACT nasal spray Place 2 sprays into both nostrils daily. 16 g 0   metFORMIN  (GLUCOPHAGE ) 850 MG tablet Take 1 tablet (850 mg total) by mouth 2 (two) times daily with a meal. 180 tablet 1   simvastatin  (ZOCOR ) 40 MG tablet SMARTSIG:1 Tablet(s) By Mouth Every Evening 90 tablet 1   valsartan  (DIOVAN ) 80 MG tablet Take 1 tablet (80 mg total) by mouth daily. 90 tablet 3   No current facility-administered medications on file prior to visit.    Allergies  Allergen Reactions   Bee Venom Shortness Of Breath and Swelling   Fish Allergy Anaphylaxis, Shortness Of Breath and Swelling    Social History   Socioeconomic History   Marital status: Divorced    Spouse name: Not on file   Number of children: 2   Years of education: Not on file   Highest education level: Associate degree: occupational, Scientist, product/process development, or vocational program  Occupational History   Not on file  Tobacco Use  Smoking status: Never   Smokeless tobacco: Never  Vaping Use   Vaping status: Never Used  Substance and Sexual Activity   Alcohol use: Not Currently   Drug use: Never   Sexual activity: Not on file  Other Topics Concern   Not on file  Social History Narrative   Son is deceased   Social Drivers of Health   Financial Resource Strain: Medium Risk (09/07/2023)   Overall Financial Resource Strain (CARDIA)    Difficulty of Paying Living Expenses: Somewhat hard  Food Insecurity: Patient Declined (09/07/2023)   Hunger Vital Sign    Worried About Running Out of Food in the Last Year: Patient declined    Ran Out of Food in the Last Year: Patient declined  Transportation Needs: No Transportation Needs (09/07/2023)   PRAPARE - Scientist, research (physical sciences) (Medical): No    Lack of Transportation (Non-Medical): No  Physical Activity: Insufficiently Active (09/07/2023)   Exercise Vital Sign    Days of Exercise per Week: 4 days    Minutes of Exercise per Session: 30 min  Stress: No Stress Concern Present (09/07/2023)   [employer] of Occupational Health - Occupational Stress Questionnaire    Feeling of Stress : Only a little  Social Connections: Moderately Isolated (09/07/2023)   Social Connection and Isolation Panel [NHANES]    Frequency of Communication with Friends and Family: Once a week    Frequency of Social Gatherings with Friends and Family: Once a week    Attends Religious Services: More than 4 times per year    Active Member of [employer] or Organizations: Yes    Attends Banker Meetings: Patient declined    Marital Status: Divorced  Catering manager Violence: Not on file    Family History  Problem Relation Age of Onset   Breast cancer Paternal Aunt        unsure of age    Past Surgical History:  Procedure Laterality Date   CESAREAN SECTION  06/15/1984    ROS: Review of Systems Negative except as stated above  PHYSICAL EXAM: BP (!) 145/80   Pulse 82   Ht 5\' 5"  (1.651 m)   Wt 234 lb (106.1 kg)   SpO2 98%   BMI 38.94 kg/m   Wt Readings from Last 3 Encounters:  09/08/23 234 lb (106.1 kg)  05/05/23 231 lb (104.8 kg)  06/26/22 241 lb (109.3 kg)    Physical Exam  General appearance - alert, well appearing older AAF, and in no distress Mental status - normal mood, behavior, speech, dress, motor activity, and thought processes Neck - supple, no significant adenopathy Chest - clear to auscultation, no wheezes, rales or rhonchi, symmetric air entry Heart - normal rate, regular rhythm, normal S1, S2, no murmurs, rubs, clicks or gallops Extremities - peripheral pulses normal, no pedal edema, no clubbing or cyanosis      Latest Ref Rng & Units 07/25/2023    9:00 AM 07/14/2023    8:33 AM  05/12/2023    9:56 AM  CMP  Glucose 70 - 99 mg/dL 161  096    BUN 6 - 24 mg/dL 8  9    Creatinine 0.45 - 1.00 mg/dL 4.09  8.11    Sodium 914 - 144 mmol/L 142  142    Potassium 3.5 - 5.2 mmol/L 4.9  5.6    Chloride 96 - 106 mmol/L 105  105    CO2 20 - 29 mmol/L 22  21  Calcium 8.7 - 10.2 mg/dL 9.9  40.9  81.1   Total Protein 6.0 - 8.5 g/dL  6.6    Total Bilirubin 0.0 - 1.2 mg/dL  0.5    Alkaline Phos 44 - 121 IU/L  51    AST 0 - 40 IU/L  19    ALT 0 - 32 IU/L  14     Lipid Panel     Component Value Date/Time   CHOL 151 05/05/2023 1021   TRIG 179 (H) 05/05/2023 1021   HDL 49 05/05/2023 1021   CHOLHDL 3.1 05/05/2023 1021   LDLCALC 72 05/05/2023 1021    CBC    Component Value Date/Time   WBC 7.4 05/05/2023 1021   RBC 4.50 05/05/2023 1021   HGB 12.7 05/05/2023 1021   HCT 41.2 05/05/2023 1021   PLT 229 05/05/2023 1021   MCV 92 05/05/2023 1021   MCH 28.2 05/05/2023 1021   MCHC 30.8 (L) 05/05/2023 1021   RDW 13.1 05/05/2023 1021    ASSESSMENT AND PLAN: 1. Type 2 diabetes mellitus with obesity (HCC) (Primary) At goal.  Continue metformin  850 mg twice a day.  Encouraged her to continue trying to eat healthy and to move as much as she can. - POCT glycosylated hemoglobin (Hb A1C) - POCT glucose (manual entry)  2. Diabetes mellitus treated with oral medication (HCC) See #1 above.  3. Hypertension associated with type 2 diabetes mellitus (HCC) Blood pressure not at goal but improved.  She reports good blood pressure readings at home.  She has been snacking on salted pretzels and nuts.  Advised that she snack on unsalted nuts or fruits.  Will bring her back in about a month to see our RN for repeat blood pressure check.  If blood pressure still above 130/80, then we will add low-dose of amlodipine  5 mg daily. -Advised to continue to check blood pressure at home, record the readings and bring with her when she comes to see the nurse for repeat check  4. Hyperlipidemia  associated with type 2 diabetes mellitus (HCC) Continue Cozaar 40 mg daily  5. Screening for colon cancer  - Ambulatory referral to Gastroenterology  6. Encounter for immunization  - Tdap vaccine greater than or equal to 7yo IM  Patient was given the opportunity to ask questions.  Patient verbalized understanding of the plan and was able to repeat key elements of the plan.   This documentation was completed using Paediatric nurse.  Any transcriptional errors are unintentional.  Orders Placed This Encounter  Procedures   Tdap vaccine greater than or equal to 7yo IM   Ambulatory referral to Gastroenterology   Recheck vitals   POCT glycosylated hemoglobin (Hb A1C)   POCT glucose (manual entry)     Requested Prescriptions    No prescriptions requested or ordered in this encounter    Return in about 4 months (around 01/06/2024) for Sign release to get last PAP smear report. RN visit in 1 mth for BP check, PCV 15 and shigles Vac.  Concetta Dee, MD, FACP

## 2023-09-15 ENCOUNTER — Other Ambulatory Visit: Payer: Self-pay | Admitting: Internal Medicine

## 2023-09-15 DIAGNOSIS — E669 Obesity, unspecified: Secondary | ICD-10-CM

## 2023-09-24 ENCOUNTER — Encounter: Payer: Self-pay | Admitting: Internal Medicine

## 2023-09-24 NOTE — Progress Notes (Unsigned)
 I received note from [facility] gastroenterology Dr. Gerald Leitz indicating that patient's last Pap smear was 11/13/2020 which was negative/HPV negative. Will send this information to my CMA so that it can be entered in pt's health maintenance chart.

## 2023-09-25 NOTE — Progress Notes (Signed)
Information has been abstracted.

## 2023-10-06 ENCOUNTER — Ambulatory Visit: Payer: BC Managed Care – PPO | Attending: Family Medicine

## 2023-10-06 ENCOUNTER — Other Ambulatory Visit: Payer: Self-pay | Admitting: Internal Medicine

## 2023-10-06 VITALS — BP 157/75 | HR 74

## 2023-10-06 DIAGNOSIS — I10 Essential (primary) hypertension: Secondary | ICD-10-CM

## 2023-10-06 DIAGNOSIS — I152 Hypertension secondary to endocrine disorders: Secondary | ICD-10-CM

## 2023-10-06 DIAGNOSIS — Z23 Encounter for immunization: Secondary | ICD-10-CM

## 2023-10-06 DIAGNOSIS — E1169 Type 2 diabetes mellitus with other specified complication: Secondary | ICD-10-CM

## 2023-10-06 DIAGNOSIS — E1159 Type 2 diabetes mellitus with other circulatory complications: Secondary | ICD-10-CM

## 2023-10-06 MED ORDER — AMLODIPINE BESYLATE 5 MG PO TABS: 5.0000 mg | ORAL_TABLET | Freq: Every day | ORAL | 0 refills | Status: DC

## 2023-10-06 MED ORDER — AMLODIPINE BESYLATE 5 MG PO TABS
5.0000 mg | ORAL_TABLET | Freq: Every day | ORAL | 0 refills | Status: DC
Start: 2023-10-06 — End: 2023-10-23

## 2023-10-06 MED ORDER — METFORMIN HCL 850 MG PO TABS: 850.0000 mg | ORAL_TABLET | Freq: Two times a day (BID) | ORAL | 1 refills | Status: DC

## 2023-10-06 NOTE — Progress Notes (Signed)
   Blood Pressure Recheck Visit  Name: Kara Gray MRN: [id number] Date of Birth: [birth year]  [name] presents today for Blood Pressure recheck with clinical support staff.  Order for BP recheck by Dr. Timoteo Expose, ordered on 10/06/2023.   BP Readings from Last 3 Encounters:  10/06/23 (!) 157/75  09/08/23 (!) 145/80  07/07/23 129/82    Current Outpatient Medications  Medication Sig Dispense Refill   ACCU-CHEK GUIDE test strip CHECK GLUCOSE IN VITRO ONCE A DAY     Accu-Chek Softclix Lancets lancets SMARTSIG:Topical     albuterol (VENTOLIN HFA) 108 (90 Base) MCG/ACT inhaler      amLODipine (NORVASC) 5 MG tablet Take 1 tablet (5 mg total) by mouth daily. 30 tablet 0   fluticasone (FLONASE) 50 MCG/ACT nasal spray Place 2 sprays into both nostrils daily. 16 g 0   metFORMIN (GLUCOPHAGE) 850 MG tablet Take 1 tablet (850 mg total) by mouth 2 (two) times daily with a meal. 180 tablet 1   simvastatin (ZOCOR) 40 MG tablet SMARTSIG:1 Tablet(s) By Mouth Every Evening 90 tablet 1   valsartan (DIOVAN) 80 MG tablet Take 1 tablet (80 mg total) by mouth daily. 90 tablet 3   No current facility-administered medications for this visit.    Hypertensive Medication Review: Patient states that they are taking all their hypertensive medications as prescribed and their last dose of hypertensive medications was this morning   Documentation of any medication adherence discrepancies: none  Provider Recommendation:  Spoke to Dr. Timoteo Expose and they stated: Patient is to Start amlodipine (NORVASC) 5 MG tablet Daily  Patient has been scheduled to follow up with  Dr. Timoteo Expose  01/05/2024  Patient has been given provider's recommendations and does not have any questions or concerns at this time. Patient will contact the office for any future questions or concerns.

## 2023-10-06 NOTE — Progress Notes (Signed)
 PNEUMOCOCCAL CONJUGATE-20  vaccine administered in right deltoid per protocols.  Information sheet given. Patient denies and pain or discomfort at injection site. Tolerated injection well no reaction.    Zoster Recombinant(Shingrix) vaccine administered in left deltoid per protocols.  Information sheet given. Patient denies and pain or discomfort at injection site. Tolerated injection well no reaction.

## 2023-10-13 LAB — HM DIABETES EYE EXAM

## 2023-10-23 ENCOUNTER — Other Ambulatory Visit: Payer: Self-pay | Admitting: Internal Medicine

## 2023-10-23 DIAGNOSIS — I152 Hypertension secondary to endocrine disorders: Secondary | ICD-10-CM

## 2024-01-02 ENCOUNTER — Other Ambulatory Visit (HOSPITAL_COMMUNITY)
Admission: RE | Admit: 2024-01-02 | Discharge: 2024-01-02 | Disposition: A | Discharge status: Home / Self Care | Source: Ambulatory Visit | Attending: Obstetrics and Gynecology | Admitting: Obstetrics and Gynecology

## 2024-01-02 ENCOUNTER — Other Ambulatory Visit: Payer: Self-pay | Admitting: Obstetrics and Gynecology

## 2024-01-02 DIAGNOSIS — Z01419 Encounter for gynecological examination (general) (routine) without abnormal findings: Secondary | ICD-10-CM | POA: Insufficient documentation

## 2024-01-02 DIAGNOSIS — Z1231 Encounter for screening mammogram for malignant neoplasm of breast: Secondary | ICD-10-CM

## 2024-01-02 LAB — HM PAP SMEAR

## 2024-01-05 ENCOUNTER — Ambulatory Visit: Payer: BC Managed Care – PPO | Attending: Internal Medicine | Admitting: Internal Medicine

## 2024-01-05 ENCOUNTER — Encounter: Payer: Self-pay | Admitting: Internal Medicine

## 2024-01-05 DIAGNOSIS — E785 Hyperlipidemia, unspecified: Secondary | ICD-10-CM

## 2024-01-05 DIAGNOSIS — Z7984 Long term (current) use of oral hypoglycemic drugs: Secondary | ICD-10-CM

## 2024-01-05 DIAGNOSIS — E1169 Type 2 diabetes mellitus with other specified complication: Secondary | ICD-10-CM

## 2024-01-05 DIAGNOSIS — Z6838 Body mass index (BMI) 38.0-38.9, adult: Secondary | ICD-10-CM

## 2024-01-05 DIAGNOSIS — I152 Hypertension secondary to endocrine disorders: Secondary | ICD-10-CM

## 2024-01-05 DIAGNOSIS — I10 Essential (primary) hypertension: Secondary | ICD-10-CM

## 2024-01-05 DIAGNOSIS — Z23 Encounter for immunization: Secondary | ICD-10-CM | POA: Diagnosis not present

## 2024-01-05 LAB — POCT GLYCOSYLATED HEMOGLOBIN (HGB A1C): HbA1c, POC (controlled diabetic range): 6.8 % (ref 0.0–7.0)

## 2024-01-05 MED ORDER — METFORMIN HCL 850 MG PO TABS: 850.0000 mg | ORAL_TABLET | Freq: Two times a day (BID) | ORAL | 2 refills | Status: AC

## 2024-01-05 MED ORDER — AMLODIPINE BESYLATE 10 MG PO TABS: 10.0000 mg | ORAL_TABLET | Freq: Every day | ORAL | 2 refills | Status: DC

## 2024-01-05 NOTE — Progress Notes (Signed)
 Patient ID: Kara Gray, female    DOB: [birth year]  MRN: [id number]  CC: Medical Management of Chronic Issues (No concerns)   Subjective: Kara Gray is a 58 y.o. female who presents for chronic ds management. Her concerns today include:  Patient with history of DM type II, HTN, HL   Discussed the use of AI scribe software for clinical note transcription with the patient, who gave verbal consent to proceed.  History of Present Illness Kara Gray is a 58 year old female with hypertension and diabetes who presents for follow-up of her blood pressure and diabetes management.  Her blood pressure is elevated at 150/82 today. She takes valsartan  80 mg and amlodipine  5 mg. She struggles to monitor her blood pressure regularly due to family responsibilities and works overnight shifts, resulting in limited sleep. Her blood pressure was 143/75 at a recent gynecologist visit. She avoids salt in her diet, using salt-free seasonings and lemon.  DM: Results for orders placed or performed in visit on 01/05/24  POCT glycosylated hemoglobin (Hb A1C)   Collection Time: 01/05/24  9:15 AM  Result Value Ref Range   Hemoglobin A1C     HbA1c POC (<> result, manual entry)     HbA1c, POC (prediabetic range)     HbA1c, POC (controlled diabetic range) 6.8 0.0 - 7.0 %  For diabetes management, she takes metformin  850 mg twice daily. Her A1c remains at 6.8. She has not been checking her blood sugars but maintains a healthy diet and walks daily for at least 20 minutes. She has lost three pounds since her last visit.  HM: Had recent Pap smear done by her gynecologist.  She will sign a release for us  to get that.  Agrees to getting second Shingrix  vaccine today.  We have requested her last eye exam.    Patient Active Problem List   Diagnosis Date Noted   Encounter for screening for other metabolic disorders 06/26/2022   Hyperlipidemia associated with type 2 diabetes mellitus (HCC) 06/26/2022    Hypertension associated with type 2 diabetes mellitus (HCC) 06/26/2022   Type 2 diabetes mellitus (HCC) 06/26/2022     Current Outpatient Medications on File Prior to Visit  Medication Sig Dispense Refill   ACCU-CHEK GUIDE test strip CHECK GLUCOSE IN VITRO ONCE A DAY     Accu-Chek Softclix Lancets lancets SMARTSIG:Topical     albuterol (VENTOLIN HFA) 108 (90 Base) MCG/ACT inhaler      fluticasone  (FLONASE ) 50 MCG/ACT nasal spray Place 2 sprays into both nostrils daily. 16 g 0   simvastatin  (ZOCOR ) 40 MG tablet TAKE 1 TABLET BY MOUTH EVERY  EVENING 90 tablet 3   valsartan  (DIOVAN ) 80 MG tablet Take 1 tablet (80 mg total) by mouth daily. 90 tablet 3   No current facility-administered medications on file prior to visit.    Allergies  Allergen Reactions   Bee Venom Shortness Of Breath and Swelling   Fish Allergy Anaphylaxis, Shortness Of Breath and Swelling    Social History   Socioeconomic History   Marital status: Divorced    Spouse name: Not on file   Number of children: 2   Years of education: Not on file   Highest education level: Associate degree: occupational, Scientist, product/process development, or vocational program  Occupational History   Not on file  Tobacco Use   Smoking status: Never   Smokeless tobacco: Never  Vaping Use   Vaping status: Never Used  Substance and Sexual Activity   Alcohol  use: Not Currently   Drug use: Never   Sexual activity: Not on file  Other Topics Concern   Not on file  Social History Narrative   Son is deceased   Social Drivers of Health   Financial Resource Strain: Medium Risk (09/07/2023)   Overall Financial Resource Strain (CARDIA)    Difficulty of Paying Living Expenses: Somewhat hard  Food Insecurity: Patient Declined (09/07/2023)   Hunger Vital Sign    Worried About Running Out of Food in the Last Year: Patient declined    Ran Out of Food in the Last Year: Patient declined  Transportation Needs: No Transportation Needs (09/07/2023)   PRAPARE -  Administrator, Civil Service (Medical): No    Lack of Transportation (Non-Medical): No  Physical Activity: Insufficiently Active (09/07/2023)   Exercise Vital Sign    Days of Exercise per Week: 4 days    Minutes of Exercise per Session: 30 min  Stress: No Stress Concern Present (09/07/2023)   [employer] of Occupational Health - Occupational Stress Questionnaire    Feeling of Stress : Only a little  Social Connections: Moderately Isolated (09/07/2023)   Social Connection and Isolation Panel [NHANES]    Frequency of Communication with Friends and Family: Once a week    Frequency of Social Gatherings with Friends and Family: Once a week    Attends Religious Services: More than 4 times per year    Active Member of [employer] or Organizations: Yes    Attends Banker Meetings: Patient declined    Marital Status: Divorced  Catering manager Violence: Not on file    Family History  Problem Relation Age of Onset   Breast cancer Paternal Aunt        unsure of age    Past Surgical History:  Procedure Laterality Date   CESAREAN SECTION  06/15/1984    ROS: Review of Systems Negative except as stated above  PHYSICAL EXAM: BP (!) 150/82   Pulse 79   Ht 5\' 5"  (1.651 m)   Wt 231 lb 9.6 oz (105.1 kg)   SpO2 99%   BMI 38.54 kg/m   Wt Readings from Last 3 Encounters:  01/05/24 231 lb 9.6 oz (105.1 kg)  09/08/23 234 lb (106.1 kg)  05/05/23 231 lb (104.8 kg)    Physical Exam  General appearance - alert, well appearing, obese older African-American female and in no distress Mental status - normal mood, behavior, speech, dress, motor activity, and thought processes Chest - clear to auscultation, no wheezes, rales or rhonchi, symmetric air entry Heart - normal rate, regular rhythm, normal S1, S2, no murmurs, rubs, clicks or gallops Extremities - peripheral pulses normal, no pedal edema, no clubbing or cyanosis      Latest Ref Rng & Units 07/25/2023    9:00 AM  07/14/2023    8:33 AM 05/12/2023    9:56 AM  CMP  Glucose 70 - 99 mg/dL 962  952    BUN 6 - 24 mg/dL 8  9    Creatinine 8.41 - 1.00 mg/dL 3.24  4.01    Sodium 027 - 144 mmol/L 142  142    Potassium 3.5 - 5.2 mmol/L 4.9  5.6    Chloride 96 - 106 mmol/L 105  105    CO2 20 - 29 mmol/L 22  21    Calcium 8.7 - 10.2 mg/dL 9.9  25.3  66.4   Total Protein 6.0 - 8.5 g/dL  6.6  Total Bilirubin 0.0 - 1.2 mg/dL  0.5    Alkaline Phos 44 - 121 IU/L  51    AST 0 - 40 IU/L  19    ALT 0 - 32 IU/L  14     Lipid Panel     Component Value Date/Time   CHOL 151 05/05/2023 1021   TRIG 179 (H) 05/05/2023 1021   HDL 49 05/05/2023 1021   CHOLHDL 3.1 05/05/2023 1021   LDLCALC 72 05/05/2023 1021    CBC    Component Value Date/Time   WBC 7.4 05/05/2023 1021   RBC 4.50 05/05/2023 1021   HGB 12.7 05/05/2023 1021   HCT 41.2 05/05/2023 1021   PLT 229 05/05/2023 1021   MCV 92 05/05/2023 1021   MCH 28.2 05/05/2023 1021   MCHC 30.8 (L) 05/05/2023 1021   RDW 13.1 05/05/2023 1021    ASSESSMENT AND PLAN:  Assessment and Plan 1. Type 2 diabetes mellitus associated with morbid obesity (HCC) (Primary) A1c is at goal. Discussed adding GLP-1 agonist to help achieve weight loss.  Patient declines at this time not wanting any additional cost and states that she has too much other stuff going on at this time and does not want to add anything.  Encouraged her to continue trying to eat healthy and to move as much as she can. - POCT glycosylated hemoglobin (Hb A1C) - metFORMIN  (GLUCOPHAGE ) 850 MG tablet; Take 1 tablet (850 mg total) by mouth 2 (two) times daily with a meal.  Dispense: 180 tablet; Refill: 2  2. Hypertension associated with type 2 diabetes mellitus (HCC) Hypertension remains uncontrolled. Current regimen: valsartan  80 mg, amlodipine  5 mg. Stress and lack of sleep may contribute. - Increase amlodipine  to 10 mg daily. - Advise weekly or biweekly blood pressure monitoring. - amLODipine  (NORVASC )  10 MG tablet; Take 1 tablet (10 mg total) by mouth daily.  Dispense: 90 tablet; Refill: 2  3. Hyperlipidemia associated with type 2 diabetes mellitus (HCC) Continue Zocor  40 mg daily.  Last LDL done in October was close to goal  4. Need for shingles vaccine Given second Shingrix  vaccine today.   Patient was given the opportunity to ask questions.  Patient verbalized understanding of the plan and was able to repeat key elements of the plan.   This documentation was completed using Paediatric nurse.  Any transcriptional errors are unintentional.  Orders Placed This Encounter  Procedures   Varicella-zoster vaccine IM   POCT glycosylated hemoglobin (Hb A1C)     Requested Prescriptions   Signed Prescriptions Disp Refills   metFORMIN  (GLUCOPHAGE ) 850 MG tablet 180 tablet 2    Sig: Take 1 tablet (850 mg total) by mouth 2 (two) times daily with a meal.   amLODipine  (NORVASC ) 10 MG tablet 90 tablet 2    Sig: Take 1 tablet (10 mg total) by mouth daily.    Return in about 4 months (around 05/06/2024) for Sign  release to get PAP results for [facility].  Concetta Dee, MD, FACP

## 2024-01-05 NOTE — Patient Instructions (Signed)
 VISIT SUMMARY:  Today, we reviewed your blood pressure and diabetes management. Your blood pressure was elevated, and we discussed the challenges you face in monitoring it regularly. Your diabetes is well-controlled, and you have been maintaining a healthy lifestyle. We also addressed some general health maintenance items.  YOUR PLAN:  -HYPERTENSION: Hypertension means high blood pressure. Your blood pressure is currently not well-controlled. We will increase your amlodipine  dose to 10 mg daily and recommend that you monitor your blood pressure weekly or biweekly.  -TYPE 2 DIABETES MELLITUS: Type 2 Diabetes Mellitus is a condition where your body does not use insulin properly, leading to high blood sugar levels. Your diabetes is well-controlled with an A1c of 6.8%. Continue taking metformin  850 mg twice daily, and we will send refills to your pharmacy. Keep up with your healthy eating and regular walking.  -GENERAL HEALTH MAINTENANCE: You are due for a diabetic eye exam and your second shingles vaccine. We will request the diabetic eye exam report from Dr. Elvina Hammers at [facility] and the Pap smear results from Dr. Xavier Heinrich at [facility]. We will administer your second shingles vaccine today.  INSTRUCTIONS:  Please schedule a follow-up appointment in 4 months to review your blood pressure and diabetes management. Make sure to monitor your blood pressure weekly or biweekly and continue with your healthy lifestyle choices.

## 2024-01-06 LAB — CYTOLOGY - PAP
Comment: NEGATIVE
Diagnosis: UNDETERMINED — AB
High risk HPV: NEGATIVE

## 2024-01-12 ENCOUNTER — Ambulatory Visit
Admission: RE | Admit: 2024-01-12 | Discharge: 2024-01-12 | Disposition: A | Discharge status: Home / Self Care | Source: Ambulatory Visit | Attending: Obstetrics and Gynecology | Admitting: Obstetrics and Gynecology

## 2024-01-12 DIAGNOSIS — Z1231 Encounter for screening mammogram for malignant neoplasm of breast: Secondary | ICD-10-CM

## 2024-02-23 DIAGNOSIS — K648 Other hemorrhoids: Secondary | ICD-10-CM | POA: Diagnosis not present

## 2024-02-23 DIAGNOSIS — Z09 Encounter for follow-up examination after completed treatment for conditions other than malignant neoplasm: Secondary | ICD-10-CM | POA: Diagnosis not present

## 2024-02-23 DIAGNOSIS — K5730 Diverticulosis of large intestine without perforation or abscess without bleeding: Secondary | ICD-10-CM | POA: Diagnosis not present

## 2024-02-23 DIAGNOSIS — D120 Benign neoplasm of cecum: Secondary | ICD-10-CM | POA: Diagnosis not present

## 2024-02-23 DIAGNOSIS — Z860101 Personal history of adenomatous and serrated colon polyps: Secondary | ICD-10-CM | POA: Diagnosis not present

## 2024-02-23 LAB — HM COLONOSCOPY

## 2024-03-13 ENCOUNTER — Other Ambulatory Visit: Payer: Self-pay | Admitting: Internal Medicine

## 2024-04-19 DIAGNOSIS — Z13220 Encounter for screening for lipoid disorders: Secondary | ICD-10-CM | POA: Diagnosis not present

## 2024-04-19 DIAGNOSIS — Z0000 Encounter for general adult medical examination without abnormal findings: Secondary | ICD-10-CM | POA: Diagnosis not present

## 2024-07-12 ENCOUNTER — Ambulatory Visit: Payer: Self-pay | Admitting: Internal Medicine

## 2024-08-17 ENCOUNTER — Other Ambulatory Visit: Payer: Self-pay | Admitting: Internal Medicine

## 2024-08-17 DIAGNOSIS — E1159 Type 2 diabetes mellitus with other circulatory complications: Secondary | ICD-10-CM
